# Patient Record
Sex: Female | Born: 2000 | Race: White | Hispanic: No | Marital: Single | Smoking: Never smoker
Health system: Southern US, Community
[De-identification: ages and names within clinical notes are randomized; demographics above are authoritative.]

## PROBLEM LIST (undated history)

## (undated) DIAGNOSIS — Q898 Other specified congenital malformations: Secondary | ICD-10-CM

## (undated) DIAGNOSIS — Q142 Congenital malformation of optic disc: Secondary | ICD-10-CM

## (undated) DIAGNOSIS — F88 Other disorders of psychological development: Secondary | ICD-10-CM

## (undated) DIAGNOSIS — I7300 Raynaud's syndrome without gangrene: Secondary | ICD-10-CM

## (undated) DIAGNOSIS — R1310 Dysphagia, unspecified: Secondary | ICD-10-CM

## (undated) DIAGNOSIS — F429 Obsessive-compulsive disorder, unspecified: Secondary | ICD-10-CM

## (undated) DIAGNOSIS — H9191 Unspecified hearing loss, right ear: Secondary | ICD-10-CM

## (undated) DIAGNOSIS — E049 Nontoxic goiter, unspecified: Secondary | ICD-10-CM

## (undated) DIAGNOSIS — Z931 Gastrostomy status: Secondary | ICD-10-CM

## (undated) DIAGNOSIS — F909 Attention-deficit hyperactivity disorder, unspecified type: Secondary | ICD-10-CM

## (undated) DIAGNOSIS — R1115 Cyclical vomiting syndrome unrelated to migraine: Secondary | ICD-10-CM

## (undated) DIAGNOSIS — F845 Asperger's syndrome: Secondary | ICD-10-CM

## (undated) DIAGNOSIS — R6250 Unspecified lack of expected normal physiological development in childhood: Secondary | ICD-10-CM

## (undated) HISTORY — DX: Cyclical vomiting syndrome unrelated to migraine: R11.15

## (undated) HISTORY — DX: Unspecified hearing loss, right ear: H91.91

## (undated) HISTORY — DX: Congenital malformation of optic disc: Q14.2

## (undated) HISTORY — PX: GASTROSTOMY W/ FEEDING TUBE: SUR642

## (undated) HISTORY — DX: Other disorders of psychological development: F88

## (undated) HISTORY — DX: Gastrostomy status: Z93.1

## (undated) HISTORY — DX: Dysphagia, unspecified: R13.10

## (undated) HISTORY — DX: Nontoxic goiter, unspecified: E04.9

## (undated) HISTORY — DX: Obsessive-compulsive disorder, unspecified: F42.9

## (undated) HISTORY — PX: NISSEN FUNDOPLICATION: SHX2091

## (undated) HISTORY — PX: TONSILLECTOMY AND ADENOIDECTOMY: SHX28

## (undated) HISTORY — DX: Unspecified lack of expected normal physiological development in childhood: R62.50

## (undated) HISTORY — DX: Other specified congenital malformations: Q89.8

## (undated) HISTORY — DX: Asperger's syndrome: F84.5

## (undated) HISTORY — DX: Attention-deficit hyperactivity disorder, unspecified type: F90.9

## (undated) HISTORY — PX: PEG TUBE PLACEMENT: SUR1034

## (undated) HISTORY — DX: Raynaud's syndrome without gangrene: I73.00

---
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
## 2021-01-13 ENCOUNTER — Encounter (INDEPENDENT_AMBULATORY_CARE_PROVIDER_SITE_OTHER): Payer: Self-pay | Admitting: Pediatric Endocrinology

## 2021-01-14 ENCOUNTER — Other Ambulatory Visit (INDEPENDENT_AMBULATORY_CARE_PROVIDER_SITE_OTHER): Payer: Self-pay | Admitting: Pediatric Endocrinology

## 2021-01-14 DIAGNOSIS — F5082 Avoidant/restrictive food intake disorder: Secondary | ICD-10-CM

## 2021-01-16 ENCOUNTER — Encounter (INDEPENDENT_AMBULATORY_CARE_PROVIDER_SITE_OTHER): Payer: Self-pay | Admitting: Pediatric Endocrinology

## 2021-01-16 ENCOUNTER — Other Ambulatory Visit: Payer: Self-pay

## 2021-01-16 ENCOUNTER — Ambulatory Visit (INDEPENDENT_AMBULATORY_CARE_PROVIDER_SITE_OTHER): Payer: BC Managed Care – PPO | Admitting: Pediatric Endocrinology

## 2021-01-16 VITALS — BP 110/60 | HR 92 | Ht 60.63 in | Wt 85.2 lb

## 2021-01-16 DIAGNOSIS — E300 Delayed puberty: Secondary | ICD-10-CM

## 2021-01-16 DIAGNOSIS — Z79899 Other long term (current) drug therapy: Secondary | ICD-10-CM

## 2021-01-16 DIAGNOSIS — Z79818 Long term (current) use of other agents affecting estrogen receptors and estrogen levels: Secondary | ICD-10-CM

## 2021-01-16 DIAGNOSIS — Q898 Other specified congenital malformations: Secondary | ICD-10-CM | POA: Diagnosis not present

## 2021-01-16 DIAGNOSIS — R6250 Unspecified lack of expected normal physiological development in childhood: Secondary | ICD-10-CM

## 2021-01-16 DIAGNOSIS — Q8989 Other specified congenital malformations: Secondary | ICD-10-CM

## 2021-01-16 DIAGNOSIS — E230 Hypopituitarism: Secondary | ICD-10-CM | POA: Diagnosis not present

## 2021-01-16 NOTE — Progress Notes (Signed)
Subjective:  Patient Name: Kayla Mckinney Date of Birth: 06-09-2000  MRN: [id number]  Kayla Mckinney  presents to the office today for follow-up evaluation and management  of her CHARGE association and growth delay  HISTORY OF PRESENT ILLNESS:   Kayla Mckinney is a 20 y.o. Caucasian female .  Kayla Mckinney was accompanied by her mother  1. Kayla Mckinney was first seen in our clinic on 11/01/09 in referral from her primary care pediatrician, Dr. 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 During the first few months of life, the baby had significant problems with reflux, vomiting, and growth delay. CHARGE Association was diagnosed at about 74 weeks of age by Dr. Ellamae Sia, who was then the geneticist at [facility] in [place]. Kayla Mckinney has since been followed by Dr. Roel Cluck of the [facility] in [place]. Her PEG feeding tube was placed at 6 months. She was at about the 10th percentile for length up through age 74, but then slowly fell off the growth curve and was at less than 3rd percentile. 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 She wears a hearing aid in her right ear. 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 The child underwent a Nissen fundoplication at about 52 months of age. The child was noted to have a poor sense of smell and abnormal taste sensation. Muscle tone was low. Her  fifth DIP joints did not bend. She had a very high tolerance for pain.  Laboratory data from 05/31/09 showed a normal CMP. Her TSH was 2.537, free T4-1 0.30, and free T3-3 0.8, all of which were normal. Her IGF 1-1 was normal at 176. Her IGF BP-3 was normal at 2.6. On 05/31/09 the child's bone age was 6 years 10 months at a chronologic age of 9 years. She had growth hormone stimulation testing on  12/21/1109/08/11. Her peak growth hormone responses were 4.83 and 7.67 respectively (normal >10).  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 However, clinical review and review of the literature does not reveal any advantage to treating CHARGE association patients with growth hormone and it may actually, be detrimental.     2. The patient's last [facility] visit was on 10/10/20. In the interim she has been generally healthy.   Mom was surprised to get a phone call from Adolescent Medicine because she did not know that Kayla Mckinney had asked for the referral.   Mom feels that her main issue is that she will not take time away from her cell phone to eat.   She is now doing 37.5 mcg Vivelle patch. She is changing it on Wed and Saturday. She already changed her patch today. Mom says that she does the patch completely on her own without prompting despite the fact that she needs constant reminders for her daily medications.   She is now taking 4 classes at [employer]. She says that she is eating when she is at school. Mom says that meals are taking a long time because she is so distracted by her phone. She is also skipping meals because she loses track of time when she is on her phone.   She is unsure if her breasts are getting bigger. She thinks that her bra is fitting about the same.   Eating is about the same. She is scheduled  in adolescent medicine on 12/12.   3. Pertinent Review of Systems:   Constitutional: The patient feels "good". The patient seems healthy and active. Eyes: Wears glasses. Neck: There are no recognized problems of the anterior neck.  Heart: There are no recognized heart problems. The ability to play and do other physical activities seems normal.  Lungs: no asthma or wheezing.  Gastrointestinal: Bowel movents seem normal. There are no recognized GI problems. G-tube. Some blood on stool recently.  Reflux Legs: Muscle mass and strength seem normal. The  child can play and perform other physical activities without obvious discomfort. No edema is noted.  Feet: There are no obvious foot problems. No edema is noted. Neurologic: There are no recognized problems with muscle movement and strength, sensation, or coordination. Ears- hearing aid- getting a new one. Has ear tubes.   Puberty- increased breast budding. Some underarm hair. Some pubic hair.  PAST MEDICAL, FAMILY, AND SOCIAL HISTORY  Past Medical History:  Diagnosis Date   ADHD (attention deficit hyperactivity disorder)    Asperger's syndrome    Aspiration of postnatal stomach contents without respiratory symptoms    CHARGE association    Coloboma, optic disc, congenital, left eye    Cyclical vomiting syndrome    G tube feedings (HCC)    Goiter    Hearing loss in right ear    Obsessive compulsive disorder    Physical growth delay    Raynaud's syndrome    Sensory processing difficulty    Swallowing difficulty     Family History  Problem Relation Age of Onset   Thyroid disease Mother        Mom has a goiter.   Thyroid disease Maternal Grandmother        MGM became hypothyroid spontaneously. never had surgery of XRT.   Diabetes Cousin      Current Outpatient Medications:    Amphetamine (ADZENYS XR-ODT PO), Take by mouth., Disp: , Rfl:    Amphetamine ER 12.5 MG TBED, daily., Disp: , Rfl:    dexmethylphenidate (FOCALIN) 10 MG tablet, Take 10 mg by mouth daily., Disp: , Rfl:    dexmethylphenidate (FOCALIN) 5 MG tablet, Take 5 mg by mouth daily. , Disp: , Rfl:    estradiol (VIVELLE-DOT) 0.0375 MG/24HR, Place 1 patch onto the skin 2 (two) times a week., Disp: 8 patch, Rfl: 12   Nutritional Supplements (DUOCAL) POWD, Take by mouth. 4 scoops every meal when she eats at home. Does not get it at school., Disp: , Rfl:    amitriptyline (ELAVIL) 10 MG tablet, Take 10 mg by mouth at bedtime. Reported on 04/12/2015 (Patient not taking: Reported on 11/30/2019), Disp: , Rfl:    hydrOXYzine  (ATARAX) 10 MG tablet, Take 10 mg by mouth at bedtime. Reported on 04/12/2015 (Patient not taking: Reported on 11/30/2019), Disp: , Rfl:    SUMAtriptan (IMITREX) 20 MG/ACT nasal spray, Place into the nose. PRN (Patient not taking: Reported on 04/04/2020), Disp: , Rfl:   Allergies as of 01/16/2021   (No Known Allergies)     reports that she has never smoked. She has never used smokeless tobacco. She reports that she does not drink alcohol and does not use drugs. Pediatric History  Patient Parents   Vannessa, [place] (Mother)   Other Topics Concern   Not on file  Social History Narrative   Lives with mom, mom's boy friend, and sister. Dad is involved.   Graduated from high school.    1st year at [employer]  Primary Care Provider: Lewis Moccasin, MD  ROS: There are no other significant problems involving Kayla Mckinney's other body systems.   Objective:  Vital Signs:   BP 110/60   Pulse 92   Ht 5' 0.63" (1.54 m)   Wt 85 lb 3.2 oz (38.6 kg)   BMI 16.30 kg/m  Blood pressure percentiles are not available for patients who are 18 years or older.   Ht Readings from Last 3 Encounters:  01/16/21 5' 0.63" (1.54 m) (8 %, Z= -1.43)*  10/10/20 5' 0.47" (1.536 m) (7 %, Z= -1.49)*  07/04/20 5' (1.524 m) (5 %, Z= -1.67)*   * Growth percentiles are based on CDC (Girls, 2-20 Years) data.   Wt Readings from Last 3 Encounters:  01/16/21 85 lb 3.2 oz (38.6 kg) (<1 %, Z= -3.43)*  10/10/20 82 lb 12.8 oz (37.6 kg) (<1 %, Z= -3.78)*  07/04/20 84 lb (38.1 kg) (<1 %, Z= -3.61)*   * Growth percentiles are based on CDC (Girls, 2-20 Years) data.   HC Readings from Last 3 Encounters:  No data found for [facility]   Body surface area is 1.28 meters squared.  8 %ile (Z= -1.43) based on CDC (Girls, 2-20 Years) Stature-for-age data based on Stature recorded on 01/16/2021. <1 %ile (Z= -3.43) based on CDC (Girls, 2-20 Years) weight-for-age data using vitals from 01/16/2021. No head circumference on file  for this encounter.   PHYSICAL EXAM:    Constitutional: The patient appears healthy and well nourished. The patient's height and weight are delayed for age. They are appropriate for bone age. Weight has overall been fluctuating in the same range for the past year. She is starting to have more linear growth  Head: The head is normocephalic. Face: broad forehead. Eyes: hypertelorism. Gaze is conjugate. There is no obvious arcus or proptosis. Moisture appears normal. Ears: Low set externally rotated ears Mouth: The oropharynx and tongue appear normal. Dentition appears to be normal for age. Oral moisture is normal. Neck: The neck appears to be visibly normal. The thyroid gland is 10 grams in size. The consistency of the thyroid gland is normal. The thyroid gland is not tender to palpation. Lungs: No increased work of breathing. CTA Heart: Heart rate regular. Pulses and peripheral perfusion regular. RRR S1S2 Abdomen: The abdomen appears to be small in size for the patient's age.  There is no obvious hepatomegaly, splenomegaly, or other mass effect.  G tube in place.  Arms: Muscle size and bulk are normal for age. Hands: There is no obvious tremor. Phalangeal and metacarpophalangeal joints are normal. Palmar muscles are normal for age. Palmar skin is normal. Palmar moisture is also normal. Legs: Muscles appear normal for age. No edema is present. Feet: Feet are normally formed. Dorsalis pedal pulses are normal. Neurologic: Strength is normal for age in both the upper and lower extremities. Muscle tone is normal. Sensation to touch is normal in both the legs and feet.   Puberty: Tanner stage pubic hair: III Tanner stage breast/genital III. Sparse Axillary hair.   LAB DATA:   No results found for this or any previous visit (from the past 504 hour(s)).  Office Visit on 07/04/2020  Component Date Value Ref Range Status   Estradiol, Ultra Sensitive 07/04/2020 20  pg/mL Final   Comment: . Female  Reference Ranges for Estradiol,  Ultrasensitive (pg/mL): Marland Kitchen   Follicular Phase:     39-375   Luteal Phase:         48-440  Postmenopausal Phase: < or = 10 . This test was developed and its analytical performance characteristics have been determined by [facility]. It has not been cleared or approved by FDA. This assay has been validated pursuant to the CLIA regulations and is used for clinical purposes.      Bone age 71/25/21  13 years at 18 years 11 months.    Assessment and Plan:   ASSESSMENT: Kayla Mckinney is a 20 y.o. 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   Growth/Weight - weight is essentially stable Kayla Mckinney requested referral to eating disorder program- referral made to adolescent med - Mom feels that she does not have issues with eating- but she is distracted by her phone and does not make time to eat or to focus on eating.  - ongoing linear growth since last visit - bone age 24 years at CA 77 and 11/12 - She is at 82% of "ideal body weight".   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  - Discussed that with inadequate weight gain growth hormone replacement can increase risks of scoliosis.   PLAN:    1. Diagnostic: Lab Orders         Estradiol, Ultra Sens         Comprehensive metabolic panel         TSH         Magnesium         VITAMIN D 25 Hydroxy (Vit-D Deficiency, Fractures)         Phosphorus         Ferritin         Celiac Disease Panel     2.  Therapeutic: Continue working on getting adequate nutrition for both growth and weight gain. Continue 37.5 mcg Vivelle dot patch. Additional labs obtained for her upcoming appointment in Adolescent Medicine clinic.  3. Patient education: Reviewed growth charts. Discussed need for adequate caloric intake, adequate sleep, for growth.  Discussed questions around eating and her phone time. Discussed referral to adolescent medicine 4. Follow-up: Return in about 3 months (around 04/16/2021).  Dessa Phi, MD  >40 minutes spent today reviewing the medical chart, counseling the patient/family, and documenting today's encounter.

## 2021-01-21 LAB — COMPREHENSIVE METABOLIC PANEL
AG Ratio: 1.6 (calc) (ref 1.0–2.5)
ALT: 15 U/L (ref 5–32)
AST: 15 U/L (ref 12–32)
Albumin: 4.5 g/dL (ref 3.6–5.1)
Alkaline phosphatase (APISO): 144 U/L — ABNORMAL HIGH (ref 36–128)
BUN: 18 mg/dL (ref 7–20)
CO2: 25 mmol/L (ref 20–32)
Calcium: 10.3 mg/dL (ref 8.9–10.4)
Chloride: 104 mmol/L (ref 98–110)
Creat: 0.67 mg/dL (ref 0.50–0.96)
Globulin: 2.8 g/dL (calc) (ref 2.0–3.8)
Glucose, Bld: 74 mg/dL (ref 65–139)
Potassium: 4.6 mmol/L (ref 3.8–5.1)
Sodium: 140 mmol/L (ref 135–146)
Total Bilirubin: 0.4 mg/dL (ref 0.2–1.1)
Total Protein: 7.3 g/dL (ref 6.3–8.2)

## 2021-01-21 LAB — CELIAC DISEASE PANEL
(tTG) Ab, IgA: <1 U/mL
(tTG) Ab, IgG: <1 U/mL
Gliadin IgA: <1 U/mL
Gliadin IgG: <1 U/mL
Immunoglobulin A: 104 mg/dL (ref 47–310)

## 2021-01-21 LAB — ESTRADIOL, ULTRA SENS: Estradiol, Ultra Sensitive: 18 pg/mL

## 2021-01-21 LAB — PHOSPHORUS: Phosphorus: 5.3 mg/dL — ABNORMAL HIGH (ref 2.7–5.0)

## 2021-01-21 LAB — FERRITIN: Ferritin: 74 ng/mL (ref 16–154)

## 2021-01-21 LAB — VITAMIN D 25 HYDROXY (VIT D DEFICIENCY, FRACTURES): Vit D, 25-Hydroxy: 19 ng/mL — ABNORMAL LOW (ref 30–100)

## 2021-01-21 LAB — TSH: TSH: 2.07 mIU/L

## 2021-01-21 LAB — MAGNESIUM: Magnesium: 2.2 mg/dL (ref 1.5–2.5)

## 2021-01-22 ENCOUNTER — Encounter (INDEPENDENT_AMBULATORY_CARE_PROVIDER_SITE_OTHER): Payer: Self-pay | Admitting: Pediatric Endocrinology

## 2021-01-28 ENCOUNTER — Ambulatory Visit: Payer: BC Managed Care – PPO | Admitting: Pediatrics

## 2021-01-28 ENCOUNTER — Other Ambulatory Visit (HOSPITAL_COMMUNITY)
Admission: RE | Admit: 2021-01-28 | Discharge: 2021-01-28 | Disposition: A | Discharge status: Home / Self Care | Payer: BC Managed Care – PPO | Source: Ambulatory Visit | Attending: Pediatrics | Admitting: Pediatrics

## 2021-01-28 ENCOUNTER — Other Ambulatory Visit: Payer: Self-pay

## 2021-01-28 VITALS — BP 97/62 | HR 96 | Ht 61.0 in | Wt 85.0 lb

## 2021-01-28 DIAGNOSIS — Z113 Encounter for screening for infections with a predominantly sexual mode of transmission: Secondary | ICD-10-CM | POA: Diagnosis not present

## 2021-01-28 DIAGNOSIS — F5082 Avoidant/restrictive food intake disorder: Secondary | ICD-10-CM | POA: Diagnosis not present

## 2021-01-28 DIAGNOSIS — E300 Delayed puberty: Secondary | ICD-10-CM | POA: Diagnosis not present

## 2021-01-28 DIAGNOSIS — E559 Vitamin D deficiency, unspecified: Secondary | ICD-10-CM | POA: Diagnosis not present

## 2021-01-28 DIAGNOSIS — Z3202 Encounter for pregnancy test, result negative: Secondary | ICD-10-CM | POA: Diagnosis not present

## 2021-01-28 DIAGNOSIS — Z1389 Encounter for screening for other disorder: Secondary | ICD-10-CM

## 2021-01-28 LAB — POCT URINALYSIS DIPSTICK
Bilirubin, UA: NEGATIVE
Blood, UA: NEGATIVE
Glucose, UA: NEGATIVE
Ketones, UA: NEGATIVE
Leukocytes, UA: NEGATIVE
Nitrite, UA: NEGATIVE
Protein, UA: POSITIVE — AB
Spec Grav, UA: 1.02 (ref 1.010–1.025)
Urobilinogen, UA: 1 E.U./dL
pH, UA: 5 (ref 5.0–8.0)

## 2021-01-28 LAB — POCT URINE PREGNANCY: Preg Test, Ur: NEGATIVE

## 2021-01-28 MED ORDER — VITAMIN D (ERGOCALCIFEROL) 1.25 MG (50000 UNIT) PO CAPS: 50000.0000 [IU] | ORAL_CAPSULE | ORAL | 0 refills | Status: AC

## 2021-01-28 MED ORDER — CYPROHEPTADINE HCL 2 MG/5ML PO SYRP
2.0000 mg | ORAL_SOLUTION | Freq: Two times a day (BID) | ORAL | 1 refills | Status: AC
Start: 2021-01-28 — End: 2021-02-27

## 2021-01-28 NOTE — Patient Instructions (Addendum)
    Using the Plate-by-Plate method:  -50% grains/starches -25% fruits/vegetables -25% protein -1 side serving of dairy or dairy alternative -1 side serving of fat/oil  The plate should be a 10 inch plate that is smooth, without ridges or inner circles. Each meal should include all 5 food groups. The plate should not look "dry" meaning foods should be cooked in fats/oils when appropriate. The meal should "make sense" and be cohesive; don't plate foods that wouldn't normally go together. Foods should have a good variety and include all of the foods previously eaten before the disorded eating started. The plate should be full and will advance to heaping. Snacks will also be added in when appropriate and contain at least 2 food groups and may add more as the plan advances.  To Schedule an ECG: [phone]

## 2021-01-28 NOTE — Progress Notes (Signed)
THIS RECORD MAY CONTAIN CONFIDENTIAL INFORMATION THAT SHOULD NOT BE RELEASED WITHOUT REVIEW OF THE SERVICE PROVIDER.  Adolescent Medicine Consultation Initial Visit Kayla Mckinney  is a 20 y.o. female referred by Fanny Bien, MD here today for evaluation of weight loss with concern for disordered eating.      Growth Chart Viewed? yes  Previsit planning completed:  yes   History was provided by the patient and mother.  PCP Confirmed?  yes  My Chart Activated?   yes    HPI:    History of CHARGE syndrome, ADHD, ASD, migraine, delayed puberty, conductive hearing loss, and weight loss. Of note had a G-tube placed at 7 months of age and used it up until ~2018. Family unable to remember last G-tube regimen combined with oral foods. Was supposed to have it removed prior to [address] states that Kayla Mckinney never thinks to eat. Is also always on her phone during meal times and gets distracted. Has very little appetite while on her ADHD meds. When not taking her meds (happens every now and then in an afternoon, mom helps with morning meds but Kayla Mckinney is responsible for her afternoon focalin), she will make avocado toast with honey, sometimes parfaits. Mom not with her for all meals so not sure how much she actually eats.  Home meds - adzenys every morning (unsure of dose), focalin 15 mg every afternoon  Meal plan:  - Breakfast - sometimes omelette or parfait - Lunch - cafeteria food, fills plate with sometimes pintos, collard greens, mac & cheese, chicken; unsure of how many bites - Dinner - chicken casserole and cantaloupe - Does not typically snack during the day - Chocolate Milk - 1 cup daily - Milk nightly (1 cup) with 4 scoops duocal (doesn't drink it when family is not present) Water intake: carries a water bottle, fills it once daily Dietitian: last seen in 04/2019 Activity level: plays with dogs, no sports or regular exercise School: [employer] care:  established with a dentist Sleep: unsure of how many hours nightly Binge/purge: denies Constipation: sometimes hard, looks like a snack, stools daily Menstrual patterns: not started, wearing estrogen patch  Denies abdominal pain, N/V/D, choking/gagging, dizziness, or headaches.  No LMP recorded. Patient is premenarcheal.  No Known Allergies Outpatient Medications Prior to Visit  Medication Sig Dispense Refill   Amphetamine (ADZENYS XR-ODT PO) Take by mouth.     dexmethylphenidate (FOCALIN) 10 MG tablet Take 10 mg by mouth daily.     dexmethylphenidate (FOCALIN) 5 MG tablet Take 5 mg by mouth daily.      estradiol (VIVELLE-DOT) 0.0375 MG/24HR Place 1 patch onto the skin 2 (two) times a week. 8 patch 12   Nutritional Supplements (DUOCAL) POWD Take by mouth. 4 scoops every meal when she eats at home. Does not get it at school.     Amphetamine ER 12.5 MG TBED daily.     hydrOXYzine (ATARAX) 10 MG tablet Take 10 mg by mouth at bedtime. Reported on 04/12/2015 (Patient not taking: Reported on 11/30/2019)     SUMAtriptan (IMITREX) 20 MG/ACT nasal spray Place into the nose. PRN (Patient not taking: Reported on 04/04/2020)     amitriptyline (ELAVIL) 10 MG tablet Take 10 mg by mouth at bedtime. Reported on 04/12/2015 (Patient not taking: Reported on 11/30/2019)     No facility-administered medications prior to visit.     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   Delayed bone age 34/04/2013   Parent coping with child illness or disability 03/09/2012   Unintentional weight loss 11/05/2011   Short stature associated with genetic disorder 11/05/2011   Goiter    Physical growth delay    Obsessive compulsive disorder    Asperger's syndrome    Aspiration of postnatal stomach contents without respiratory symptoms    G tube feedings (HCC)    Coloboma, optic disc,  congenital, left eye    Hearing loss in right ear    ADHD (attention deficit hyperactivity disorder)    Sensory processing difficulty    Raynaud's syndrome    Swallowing difficulty    Cyclical vomiting syndrome    Lack of expected normal physiological development in childhood 07/04/2010   Goiter, unspecified 07/04/2010   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    Past Medical History:  Reviewed and updated?  yes Past Medical History:  Diagnosis Date   ADHD (attention deficit hyperactivity disorder)    Asperger's syndrome    Aspiration of postnatal stomach contents without respiratory symptoms    CHARGE association    Coloboma, optic disc, congenital, left eye    Cyclical vomiting syndrome    G tube feedings (HCC)    Goiter    Hearing loss in right ear    Obsessive compulsive disorder    Physical growth delay    Raynaud's syndrome    Sensory processing difficulty    Swallowing difficulty     Family History: Reviewed and updated? yes Family History  Problem Relation Age of Onset   Thyroid disease Mother        Mom has a goiter.   Thyroid disease Maternal Grandmother        MGM became hypothyroid spontaneously. never had surgery of XRT.   Diabetes Cousin     Social History: Lives with:  mother and step-dad and sister  and describes home situation as good School: taking classes at [employer]:  unsure Exercise:  none Sports:  none Sleep:  as above  Confidentiality was discussed with the patient and if applicable, with caregiver as well.  Enter confidential phone number in Family Comments section of [employer] Tobacco?  no Drugs/ETOH?  no Sexually Active?  no   Trauma currently or in the pastt?  History of frequent medial encounters in the setting of CHARGE syndrome Suicidal or Self-Harm thoughts?   no  The following portions of the patient's history were reviewed and updated as appropriate: allergies, current medications, past family history, past medical  history, past social history, past surgical history, and problem list.  Physical Exam:  Vitals:   01/28/21 1428 01/28/21 1442  BP: 93/60 97/62  Pulse: (!) 103 96  Weight: 85 lb (38.6 kg)   Height: _0  (1.549 m)    BP 97/62   Pulse 96   Ht _1  (1.549 m)   Wt 85 lb (38.6 kg)   BMI 16.06 kg/m  Body mass index: body mass index is 16.06 kg/m. Blood pressure percentiles are not available for patients who are 18 years or older.  Physical Exam Vitals and nursing note reviewed.  Constitutional:      Appearance: Normal appearance.  HENT:     Nose: Nose normal.     Mouth/Throat:     Mouth: Mucous membranes are moist.     Pharynx: Oropharynx is clear.  Eyes:     Conjunctiva/sclera: Conjunctivae normal.  Cardiovascular:  Rate and Rhythm: Normal rate and regular rhythm.  Pulmonary:     Effort: Pulmonary effort is normal.  Abdominal:     General: Abdomen is flat. There is no distension.     Palpations: Abdomen is soft. There is no mass.     Tenderness: There is no abdominal tenderness. There is no rebound.     Comments: G-tube site c/d/i  Musculoskeletal:     Cervical back: Normal range of motion and neck supple.  Skin:    General: Skin is warm and dry.     Capillary Refill: Capillary refill takes less than 2 seconds.  Neurological:     Mental Status: She is alert.     Assessment/Plan: 1. Pregnancy examination or test, negative result Denies current or prior sexual activity, screening completed per protocol - POCT urine pregnancy  2. Routine screening for STI (sexually transmitted infection) Denies current or prior sexual activity, screening completed per protocol - Urine cytology ancillary only  3. Avoidant-restrictive food intake disorder (ARFID) Patient with a history of CHARGE syndrome, ADHD, ASD, delayed puberty, conductive hearing loss, and weight loss. Clinically meets criteria for ARFID today given lack of interest in eating accompanied by failure to  achieve expected weight gain. BMI currently reflecting underweight status at 16. It appears as if Madolyn's weight has progressively downtrended since she stopped supplementing her oral intake with G-tube feeds ~3 years ago - Amb ref to Medical Nutrition Therapy-MNT; will work closely with nutrition to come up with a plan to fulfill caloric needs with oral foods, but unsure if Clotine will be able to meet such goals given her lack of interest in eating and frequent lack of supervision at meal times. Appreciate recommendations regarding whether or not to reinitiate supplementation with enteral feeds. Also encouraged family to re-establish with peds surgery to recheck if G-tube is still functional  - Will start cyproheptadine 2 mg BID - Given risk for refeeding syndrome with obtain EKG 12-Lead - Plate-by-Plate Approach handout provided - Follow up in 3 weeks for refeeding labs and weight recheck, at next visit will have conversation regarding mother's thoughts on obtaining guardianship if there are concerns that Domanique does not have the capacity to always remember to eat/take her medicines   4. Delayed puberty Has not yet started menstruation in the setting of her medical comorbidities, low BMI is likely contributing.  - On transdermal estrogen and following closely with peds endo  5. Vitamin D deficiency Vitamin D level obtained 01/16/21 low at 19 - Vitamin D, Ergocalciferol, (DRISDOL) 1.25 MG (50000 UNIT) CAPS capsule; Take 1 capsule (50,000 Units total) by mouth every 7 (seven) days.  Dispense: 15 capsule; Refill: 0 - Will likely need future DEXA scan   Follow-up:   Return in about 3 weeks (around 02/18/2021) for refeeding labs and weight check.   Medical decision-making:  > 60 minutes spent, more than 50% of appointment was spent discussing diagnosis and management of symptoms

## 2021-01-28 NOTE — Progress Notes (Deleted)
Integrated Behavioral Health Initial In-Person Visit  MRN: [id number] Name: AYSHA LIVECCHI  Number of Integrated Behavioral Health Clinician visits:: {IBH Number of Visits:21014052} Session Start time: ***  Session End time: *** Total time: {IBH Total Time:21014050} minutes  Types of Service: {CHL AMB TYPE OF SERVICE:[phone]}  Interpretor:{yes AQ:[id number]} Interpretor Name and Language: ***   Warm Hand Off Completed.        Subjective: ELVIRA LANGSTON is a 20 y.o.  accompanied by {CHL AMB ACCOMPANIED FH:[id number]} Patient was referred by *** for ***. Patient reports the following symptoms/concerns: *** Duration of problem: ***; Severity of problem: {Mild/Moderate/Severe:20260}  Objective: Mood: {BHH MOOD:22306} and Affect: {BHH AFFECT:22307} Risk of harm to self or others: {CHL AMB BH Suicide Current Mental Status:21022748}  Life Context: Family and Social: *** School/Work: *** Self-Care: *** Life Changes: ***  Patient and/or Family's Strengths/Protective Factors: {CHL AMB BH PROTECTIVE FACTORS:[phone]}  Goals Addressed: Patient will: Reduce symptoms of: {IBH Symptoms:21014056} Increase knowledge and/or ability of: {IBH Patient Tools:21014057}  Demonstrate ability to: {IBH Goals:21014053}  Progress towards Goals: {CHL AMB BH PROGRESS TOWARDS [phone]}  Interventions: Interventions utilized: {IBH Interventions:21014054}  Standardized Assessments completed: {IBH Screening Tools:21014051}  Patient and/or Family Response: ***  Patient Centered Plan: Patient is on the following Treatment Plan(s):  ***  Assessment: Patient currently experiencing ***.   Patient may benefit from ***.  Plan: Follow up with behavioral health clinician on : *** Behavioral recommendations: *** Referral(s): {IBH Referrals:21014055} "From scale of 1-10, how likely are you to follow plan?": ***  Cquadayshia L Cedric Fishman

## 2021-01-30 LAB — URINE CYTOLOGY ANCILLARY ONLY
Bacterial Vaginitis-Urine: NEGATIVE
Candida Urine: NEGATIVE
Chlamydia: NEGATIVE
Comment: NEGATIVE
Comment: NEGATIVE
Comment: NORMAL
Neisseria Gonorrhea: NEGATIVE
Trichomonas: NEGATIVE

## 2021-02-05 DIAGNOSIS — E559 Vitamin D deficiency, unspecified: Secondary | ICD-10-CM | POA: Insufficient documentation

## 2021-02-05 DIAGNOSIS — F5082 Avoidant/restrictive food intake disorder: Secondary | ICD-10-CM | POA: Insufficient documentation

## 2021-04-29 ENCOUNTER — Other Ambulatory Visit: Payer: Self-pay | Admitting: Pediatrics

## 2021-04-29 DIAGNOSIS — E559 Vitamin D deficiency, unspecified: Secondary | ICD-10-CM

## 2021-07-22 ENCOUNTER — Telehealth: Payer: BC Managed Care – PPO | Admitting: Physician Assistant

## 2021-07-22 DIAGNOSIS — J329 Chronic sinusitis, unspecified: Secondary | ICD-10-CM

## 2021-07-22 MED ORDER — AMOXICILLIN-POT CLAVULANATE 875-125 MG PO TABS: 1.0000 | ORAL_TABLET | Freq: Two times a day (BID) | ORAL | 0 refills | Status: AC

## 2021-07-22 NOTE — Progress Notes (Signed)

## 2021-09-19 HISTORY — PX: WISDOM TOOTH EXTRACTION: SHX21

## 2021-11-25 ENCOUNTER — Other Ambulatory Visit (INDEPENDENT_AMBULATORY_CARE_PROVIDER_SITE_OTHER): Payer: Self-pay | Admitting: Pediatric Endocrinology

## 2021-12-25 ENCOUNTER — Ambulatory Visit (INDEPENDENT_AMBULATORY_CARE_PROVIDER_SITE_OTHER): Payer: BC Managed Care – PPO | Admitting: Pediatric Endocrinology

## 2021-12-25 ENCOUNTER — Encounter (INDEPENDENT_AMBULATORY_CARE_PROVIDER_SITE_OTHER): Payer: Self-pay | Admitting: Pediatric Endocrinology

## 2021-12-25 VITALS — BP 112/74 | HR 96 | Ht 60.39 in | Wt 97.6 lb

## 2021-12-25 DIAGNOSIS — R6252 Short stature (child): Secondary | ICD-10-CM | POA: Diagnosis not present

## 2021-12-25 DIAGNOSIS — Q898 Other specified congenital malformations: Secondary | ICD-10-CM | POA: Diagnosis not present

## 2021-12-25 DIAGNOSIS — Z931 Gastrostomy status: Secondary | ICD-10-CM

## 2021-12-25 DIAGNOSIS — E230 Hypopituitarism: Secondary | ICD-10-CM

## 2021-12-25 DIAGNOSIS — Z79818 Long term (current) use of other agents affecting estrogen receptors and estrogen levels: Secondary | ICD-10-CM

## 2021-12-25 DIAGNOSIS — Z79899 Other long term (current) drug therapy: Secondary | ICD-10-CM

## 2021-12-25 LAB — ESTRADIOL: Estradiol: <15 pg/mL

## 2021-12-25 NOTE — Progress Notes (Signed)
Subjective:  Patient Name: Kayla Mckinney Date of Birth: 03-27-2000  MRN: [id number]  Kayla Mckinney  presents to the office today for follow-up evaluation and management  of her CHARGE association and growth delay  HISTORY OF PRESENT ILLNESS:   Kayla Mckinney is a 21 y.o. Caucasian female .  Danahi was accompanied by her mother and sister   1. Kayla Mckinney was first seen in our clinic on 11/01/09 in referral from her primary care pediatrician, Dr. 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 During the first few months of life, the baby had significant problems with reflux, vomiting, and growth delay. CHARGE Association was diagnosed at about 20 weeks of age by Dr. Ellamae Sia, who was then the geneticist at [facility] in [place]. Kayla Mckinney has since been followed by Dr. Roel Cluck of the [facility] in [place]. Her PEG feeding tube was placed at 6 months. She was at about the 10th percentile for length up through age 25, but then slowly fell off the growth curve and was at less than 3rd percentile. 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 She wears a hearing aid in her right ear. 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 The child underwent a Nissen fundoplication at about 8 months of age. The child was noted to have a poor sense of smell and abnormal taste sensation. Muscle tone was low. Her  fifth DIP joints did not bend. She had a very high tolerance for pain.  Laboratory data from 05/31/09 showed a normal CMP. Her TSH was 2.537, free T4-1 0.30, and free T3-3 0.8, all of which were normal. Her IGF 1-1 was normal at 176. Her IGF BP-3 was normal at 2.6. On 05/31/09 the child's bone age was 6 years 10 months at a chronologic age of 9 years. She had growth hormone stimulation testing  on 12/21/1109/08/11. Her peak growth hormone responses were 4.83 and 7.67 respectively (normal >10).  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 However, clinical review and review of the literature does not reveal any advantage to treating CHARGE association patients with growth hormone and it may actually, be detrimental.     2. The patient's last [facility] visit was on 01/16/21. In the interim she has been generally healthy.   She has continued on Vivelle dot. She changes her patch on Wednesdays and Saturdays.  She feels that her breasts have gotten bigger. She had to get new bras. Her sister says that she is more moody.   She is now doing 37.5 mcg Vivelle patch.  She has been complaining about her g-tube for the past 2 days after she checked her balloon.   She has not been getting much discharge. She feels that she saw it once.   She feels that she is "doing great" with eating. Kayla Mckinney feeds her breakfast in the morning. She likes carrots for a snack.   3. Pertinent Review of Systems:   Constitutional: The patient feels "good". The patient seems healthy and active. Eyes: Wears glasses. Neck: There are no recognized problems of the anterior neck.  Heart: There are no recognized heart problems. The ability to play and do other physical activities seems normal.  Lungs: no asthma or wheezing.  Gastrointestinal: Bowel movents seem normal. There are no recognized GI problems. G-tube. Some blood  on stool recently.  Issues with her g-tube Legs: Muscle mass and strength seem normal. The child can play and perform other physical activities without obvious discomfort. No edema is noted.  Feet: There are no obvious foot problems. No edema is noted. Neurologic: There are no recognized problems with muscle movement and strength, sensation, or coordination. Ears- hearing aid- getting a new one. Has ear tubes.   Puberty- increased breast growth.   PAST MEDICAL, FAMILY, AND SOCIAL  HISTORY  Past Medical History:  Diagnosis Date   ADHD (attention deficit hyperactivity disorder)    Asperger's syndrome    Aspiration of postnatal stomach contents without respiratory symptoms    CHARGE association    Coloboma, optic disc, congenital, left eye    Cyclical vomiting syndrome    G tube feedings (HCC)    Goiter    Hearing loss in right ear    Obsessive compulsive disorder    Physical growth delay    Raynaud's syndrome    Sensory processing difficulty    Swallowing difficulty     Family History  Problem Relation Age of Onset   Thyroid disease Mother        Kayla Mckinney has a goiter.   Thyroid disease Maternal Grandmother        MGM became hypothyroid spontaneously. never had surgery of XRT.   Diabetes Cousin      Current Outpatient Medications:    Amphetamine (ADZENYS XR-ODT PO), Take by mouth., Disp: , Rfl:    dexmethylphenidate (FOCALIN) 10 MG tablet, Take 10 mg by mouth daily., Disp: , Rfl:    dexmethylphenidate (FOCALIN) 5 MG tablet, Take 5 mg by mouth daily. , Disp: , Rfl:    DOTTI 0.0375 MG/24HR, Place 1 patch onto the skin 2 (two) times a week., Disp: 8 patch, Rfl: 12   Nutritional Supplements (DUOCAL) POWD, Take by mouth. 4 scoops every meal when she eats at home. Does not get it at school., Disp: , Rfl:    SUMAtriptan (IMITREX) 20 MG/ACT nasal spray, Place into the nose. PRN, Disp: , Rfl:    Vitamin D, Ergocalciferol, (DRISDOL) 1.25 MG (50000 UNIT) CAPS capsule, Take 1 capsule (50,000 Units total) by mouth every 7 (seven) days., Disp: 15 capsule, Rfl: 0   cyproheptadine (PERIACTIN) 2 MG/5ML syrup, Take 5 mLs (2 mg total) by mouth 2 (two) times daily., Disp: 300 mL, Rfl: 1   hydrOXYzine (ATARAX) 10 MG tablet, Take 10 mg by mouth at bedtime. Reported on 04/12/2015 (Patient not taking: Reported on 11/30/2019), Disp: , Rfl:   Allergies as of 12/25/2021   (No Known Allergies)     reports that she has never smoked. She has never been exposed to tobacco smoke. She has  never used smokeless tobacco. She reports that she does not drink alcohol and does not use drugs. Pediatric History  Patient Parents   Janazia, Schreier (Mother)   Other Topics Concern   Not on file  Social History Narrative   Lives with Kayla Mckinney, Kayla Mckinney's boy friend, and sister. Dad is involved.   Graduated from high school.    2nd year at [employer]   Primary Care Provider: Lewis Moccasin, MD  ROS: There are no other significant problems involving Margerie's other body systems.   Objective:  Vital Signs:   BP 112/74 (BP Location: Right Arm, Patient Position: Sitting, Cuff Size: Small)   Pulse 96   Ht 5' 0.39" (1.534 m)   Wt 97 lb 9.6 oz (44.3 kg)   LMP  (  Exact Date)   BMI 18.81 kg/m  Growth %ile SmartLinks can only be used for patients less than 63 years old.   Ht Readings from Last 3 Encounters:  12/25/21 5' 0.39" (1.534 m)  01/28/21 5\' 1"  (1.549 m) (10 %, Z= -1.29)*  01/16/21 5' 0.63" (1.54 m) (8 %, Z= -1.43)*   * Growth percentiles are based on CDC (Girls, 2-20 Years) data.   Wt Readings from Last 3 Encounters:  12/25/21 97 lb 9.6 oz (44.3 kg)  01/28/21 85 lb (38.6 kg) (<1 %, Z= -3.45)*  01/16/21 85 lb 3.2 oz (38.6 kg) (<1 %, Z= -3.43)*   * Growth percentiles are based on CDC (Girls, 2-20 Years) data.   HC Readings from Last 3 Encounters:  No data found for [facility]   Body surface area is 1.37 meters squared.  Facility age limit for growth %iles is 20 years. Facility age limit for growth %iles is 20 years. Facility age limit for growth %iles is 20 years.   PHYSICAL EXAM:    Constitutional: The patient appears healthy and well nourished. The patient's height and weight are delayed for age. Height is stable. She has had good interval weight gain. (12 pounds) Head: The head is normocephalic. Face: broad forehead. Eyes: hypertelorism. Gaze is conjugate. There is no obvious arcus or proptosis. Moisture appears normal. Ears: Low set externally rotated ears Mouth:  The oropharynx and tongue appear normal. Dentition appears to be normal for age. Oral moisture is normal. Neck: The neck appears to be visibly normal. The thyroid gland is 10 grams in size. The consistency of the thyroid gland is normal. The thyroid gland is not tender to palpation. Lungs: No increased work of breathing. CTA Heart: Heart rate regular. Pulses and peripheral perfusion regular. RRR S1S2 Abdomen: The abdomen appears to be small in size for the patient's age.  There is no obvious hepatomegaly, splenomegaly, or other mass effect.  G tube in place.  Arms: Muscle size and bulk are normal for age. Hands: There is no obvious tremor. Phalangeal and metacarpophalangeal joints are normal. Palmar muscles are normal for age. Palmar skin is normal. Palmar moisture is also normal. Legs: Muscles appear normal for age. No edema is present. Feet: Feet are normally formed. Dorsalis pedal pulses are normal. Neurologic: Strength is normal for age in both the upper and lower extremities. Muscle tone is normal. Sensation to touch is normal in both the legs and feet.   Puberty: Tanner stage pubic hair: III Tanner stage breast/genital III. Sparse Axillary hair.   LAB DATA:   No results found for this or any previous visit (from the past 504 hour(s)).    Assessment and Plan:   ASSESSMENT: Lillien is a 21 y.o. 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  - Ultrasound ordered   PLAN:    1.  Diagnostic:  Lab Orders         Estradiol     Orders Placed This Encounter  Procedures  US PELVIS (TRANSABDOMINAL ONLY)    Patient with CHARGE syndrome. Unsure if she has uterus/ovaries    Scheduling Instructions:     DO NOT perform transvaginal.    Order Specific Question:   Reason for Exam (SYMPTOM  OR DIAGNOSIS REQUIRED)    Answer:   DO NOT perform transvaginally.    Order Specific Question:   Preferred imaging location?    Answer:   [facility]   Estradiol   Ambulatory referral to Pediatric Surgery    Referral Priority:   Routine    Referral Type:   Surgical    Referral Reason:   Specialty Services Required    Requested Specialty:   Pediatric Surgery    Number of Visits Requested:   1     2. Therapeutic: Continue working on getting adequate nutrition for both growth and weight gain. Continue 37.5 mcg Vivelle dot patch.   Referral placed to surgery for removal of G-Tube. Case discussed with Dr. Gus Puma who has accepted this referral as long as it is uncomplicated. If the hole does not close she will need to be referred to adult GI for secondary revision.   3. Patient education: Reviewed growth charts. Discussed expectation that she will need HRT long term. Discussed that (per Kayla Mckinney) she has never had imaging to determine presence/absence of uterus/ovaries. Will order transabdominal pelvic u/s.   4. Follow-up: Return in about 6 months (around 06/25/2022).  Dessa Phi, MD  >40 minutes spent today reviewing the medical chart, counseling the patient/family, and documenting today's encounter.

## 2021-12-26 ENCOUNTER — Encounter (INDEPENDENT_AMBULATORY_CARE_PROVIDER_SITE_OTHER): Payer: Self-pay | Admitting: Pediatric Endocrinology

## 2021-12-30 ENCOUNTER — Other Ambulatory Visit (INDEPENDENT_AMBULATORY_CARE_PROVIDER_SITE_OTHER): Payer: Self-pay | Admitting: Pediatric Endocrinology

## 2021-12-30 MED ORDER — ESTRADIOL 0.025 MG/24HR TD PTWK: 0.0250 mg | MEDICATED_PATCH | TRANSDERMAL | 5 refills | Status: DC

## 2021-12-31 ENCOUNTER — Telehealth (INDEPENDENT_AMBULATORY_CARE_PROVIDER_SITE_OTHER): Payer: Self-pay | Admitting: Pediatric Endocrinology

## 2021-12-31 ENCOUNTER — Encounter (INDEPENDENT_AMBULATORY_CARE_PROVIDER_SITE_OTHER): Payer: Self-pay | Admitting: Pediatric Endocrinology

## 2021-12-31 NOTE — Telephone Encounter (Signed)
A user error has taken place: encounter opened in error, closed for administrative reasons.

## 2022-01-02 ENCOUNTER — Telehealth (INDEPENDENT_AMBULATORY_CARE_PROVIDER_SITE_OTHER): Payer: Self-pay | Admitting: Pediatric Endocrinology

## 2022-01-02 NOTE — Telephone Encounter (Signed)
Contacted Dr. Chauncy Passy office to request referral be placed to adult surgeon for removal of her G-Tube with primary closure.   Dessa Phi, MD

## 2022-01-15 ENCOUNTER — Ambulatory Visit (HOSPITAL_BASED_OUTPATIENT_CLINIC_OR_DEPARTMENT_OTHER)
Admission: RE | Admit: 2022-01-15 | Discharge: 2022-01-15 | Disposition: A | Discharge status: Home / Self Care | Payer: BC Managed Care – PPO | Source: Ambulatory Visit | Attending: Pediatric Endocrinology | Admitting: Pediatric Endocrinology

## 2022-01-15 DIAGNOSIS — E230 Hypopituitarism: Secondary | ICD-10-CM | POA: Insufficient documentation

## 2022-01-24 ENCOUNTER — Telehealth (INDEPENDENT_AMBULATORY_CARE_PROVIDER_SITE_OTHER): Payer: Self-pay | Admitting: Nurse Practitioner

## 2022-01-24 ENCOUNTER — Ambulatory Visit (INDEPENDENT_AMBULATORY_CARE_PROVIDER_SITE_OTHER): Payer: BC Managed Care – PPO | Admitting: Surgery

## 2022-01-24 NOTE — Telephone Encounter (Signed)
I spoke to Ms. Gonsoulin regarding Kayla Mckinney's appointment with Dr. Gus Puma. Ms. Kern stated she was unaware of the referral to adult surgery for the g-tube removal (placed by Dr. Vanessa Chewelah on 11/16). I discussed the recommendation to send the referral to [place] [facility]. Ms. Busser was in agreement. I called Dr. Chauncy Passy office regarding the referral. The front office representative stated, "we were waiting on mom to tell us where to send the referral." I informed the representative of my discussion with Ms. Sharpe and requested the referral be sent to [facility]. The representative stated she would send the referral.

## 2022-01-25 ENCOUNTER — Encounter (HOSPITAL_COMMUNITY): Payer: Self-pay

## 2022-01-25 ENCOUNTER — Emergency Department (HOSPITAL_COMMUNITY)
Admission: EM | Admit: 2022-01-25 | Discharge: 2022-01-25 | Disposition: A | Discharge status: Home / Self Care | Payer: BC Managed Care – PPO | Attending: Emergency Medicine | Admitting: Emergency Medicine

## 2022-01-25 DIAGNOSIS — Z8616 Personal history of COVID-19: Secondary | ICD-10-CM | POA: Insufficient documentation

## 2022-01-25 DIAGNOSIS — K9423 Gastrostomy malfunction: Secondary | ICD-10-CM | POA: Insufficient documentation

## 2022-01-25 DIAGNOSIS — T85528A Displacement of other gastrointestinal prosthetic devices, implants and grafts, initial encounter: Secondary | ICD-10-CM

## 2022-01-25 NOTE — ED Provider Notes (Signed)
12:45 PM call placed to general surgery and spoke to Dr. Michaell Cowing, discussed the case with him as patient is currently in the waiting room and has been for the past 4 hours.  Patient feeding tube did fall out yesterday.  He reports that the feeding tube has not been used in several years, this can likely be covered with some gauze and they will spontaneously close on its own.  If the feeding tube is still being used, a balloon Foley could be used in order to keep the opening available.  If it has not been used for medicine will likely not need any intervention at this time.  I did report to him that feeding tube has not been used in several years and there is no surrounding erythema, however they have not been able to schedule an outpatient surgeon to have this hole closed.  This information will be relayed to mother in the triage area.   Portions of this note were generated with Scientist, clinical (histocompatibility and immunogenetics). Dictation errors may occur despite best attempts at proofreading.    Claude Manges, PA-C 01/25/22 1250    Bethann Berkshire, MD 01/26/22 [phone]

## 2022-01-25 NOTE — Discharge Instructions (Signed)
Please have Dr. Duanne Guess place a consult for Dr. Michaell Cowing, Adult General Surgery - and once the consult is in schedule an appointment with Dr. Michaell Cowing.  Keep the ostomy bag on over the wound site for now. You may also place a gauze with tape on it in future. Just make sure the site is staying clean and sterile. If it is too moist and gets raw, apply a zinc oxide barrier cream (over the counter).  Please return to the ER if your symptoms get worse.

## 2022-01-25 NOTE — ED Provider Triage Note (Signed)
Emergency Medicine Provider Triage Evaluation Note  Kayla Mckinney , a 21 y.o. female  was evaluated in triage.  Pt complains of feeding tube coming out in the middle of the night. Patient's mother reports this has not been used for over a decade however they were told it needed to be surgically removed.   Review of Systems  Positive: Abdominal pain Negative: Nausea, vomiting  Physical Exam  BP 100/66 (BP Location: Left Arm)   Pulse 95   Temp 98 F (36.7 C) (Oral)   Resp 18   SpO2 100%  Gen:   Awake, no distress   Resp:  Normal effort  MSK:   Moves extremities without difficulty  Other:  Small opening where feeding tube was previously present,no erythema or leaking on the abdomen.   Medical Decision Making  Medically screening exam initiated at 9:28 AM.  Appropriate orders placed.  Kayla Mckinney was informed that the remainder of the evaluation will be completed by another provider, this initial triage assessment does not replace that evaluation, and the importance of remaining in the ED until their evaluation is complete.     Claude Manges, PA-C 01/25/22 [phone]

## 2022-01-25 NOTE — ED Triage Notes (Signed)
Pt presents with c/o feeding tube issues. Pt reports that her feeding tube came out last night. Pt does not currently use her feeding tube and a referral has been in place to have the feeding tube removed surgically.

## 2022-01-25 NOTE — ED Provider Notes (Signed)
[facility]GENCY DEPT Provider Note   CSN: [id number] Arrival date & time: 01/25/22  0815     History  Chief Complaint  Patient presents with   Feeding Tube issue    Kayla Mckinney is a 21 y.o. female.  HPI     21 year old female comes in with chief complaint of feeding tube issues.  Patient had a feeding tube placed when she was 52 months old due to dysphagia, cyclic vomiting syndrome.  Mother provides substantial history.  She states that patient was supposed to get a G-tube taken out pre-COVID.  However, due to COVID-19 lockdown, that surgery was canceled.  Patient was lost to follow-up thereafter.  Recently they have revisited the conversation, and she visited the pediatric general surgeon yesterday, who recommended that patient see an adult surgeon as sometimes surgical intervention might be needed to prevent complications.  Last night, the G-tube fell out.  Mom states that the balloon was " busted."  Patient has no complaints from her side.  She has not had any discharge or drainage but has not eaten or had anything to drink either.  Home Medications Prior to Admission medications   Medication Sig Start Date End Date Taking? Authorizing Provider  estradiol (CLIMARA - DOSED IN MG/24 HR) 0.025 mg/24hr patch Place 1 patch (0.025 mg total) onto the skin once a week. 12/30/21   Dessa Phi, MD  Amphetamine (ADZENYS XR-ODT PO) Take by mouth.    [provider]  cyproheptadine (PERIACTIN) 2 MG/5ML syrup Take 5 mLs (2 mg total) by mouth 2 (two) times daily. 01/28/21 02/27/21  Isla Pence, MD  dexmethylphenidate (FOCALIN) 10 MG tablet Take 10 mg by mouth daily.    [provider]  dexmethylphenidate (FOCALIN) 5 MG tablet Take 5 mg by mouth daily.     [provider]  hydrOXYzine (ATARAX) 10 MG tablet Take 10 mg by mouth at bedtime. Reported on 04/12/2015 Patient not taking: Reported on 11/30/2019    [provider]  Nutritional Supplements (DUOCAL) POWD Take by mouth. 4 scoops every meal when she eats at home. Does not get it at school. 07/05/19   [provider]  SUMAtriptan (IMITREX) 20 MG/ACT nasal spray Place into the nose. PRN 11/07/16   [provider]  Vitamin D, Ergocalciferol, (DRISDOL) 1.25 MG (50000 UNIT) CAPS capsule Take 1 capsule (50,000 Units total) by mouth every 7 (seven) days. 01/28/21   Isla Pence, MD      Allergies    Patient has no known allergies.    Review of Systems   Review of Systems  All other systems reviewed and are negative.   Physical Exam Updated Vital Signs BP 107/71   Pulse 99   Temp 98.9 F (37.2 C) (Oral)   Resp 18   SpO2 100%  Physical Exam Vitals and nursing note reviewed.  Constitutional:      Appearance: She is well-developed.  HENT:     Head: Atraumatic.  Cardiovascular:     Rate and Rhythm: Normal rate.  Pulmonary:     Effort: Pulmonary effort is normal.  Abdominal:     General: Abdomen is flat. There is no distension.     Comments: Left lower quadrant stoma, no drainage  Musculoskeletal:     Cervical back: Normal range of motion and neck supple.  Skin:    General: Skin is warm and dry.  Neurological:     Mental Status: She is alert and oriented to person, place, and  time.     ED Results / Procedures / Treatments   Labs (all labs ordered are listed, but only abnormal results are displayed) Labs Reviewed - No data to display  EKG None  Radiology No results found.  Procedures Procedures    Medications Ordered in ED Medications - No data to display  ED Course/ Medical Decision Making/ A&P                           Medical Decision Making  This patient presents to the ED with chief complaint(s) of G-tube dislodgment with pertinent past medical history of G-tube that was in place for 20 years, and concerns from her pediatric surgeon that she might need surgical intervention at the time of G-tube  removal.The complaint involves an extensive differential diagnosis and also carries with it a high risk of complications and morbidity.    Patient has no complaints from her site.  The stoma site looks normal.  History provided by patient's mother.  Care everywhere also reviewed. According to care everywhere, in 2019 December, patient had a visit with pediatrics general surgery.  Their plan was to see if they can trial G-tube removal and to see if the tract/stoma closes on its own.  If it would fail, and there is a chance it can, they would advance with surgical intervention.  Case discussed with general surgery on-call, Dr. Michaell Cowing. We discussed dislodged G-tube and patient's complex history, low BMI and the fact that patient has not been using G-tube for years and is in the process of wanting it removed and family concerns that it might need surgical intervention at the time of G-tube removal.   Dr. Michaell Cowing recommends that if family wants more time to decide the next step, then we can place a Foley catheter in the stoma and have patient follow-up with general surgery. If the overall goal is for removal of the G-tube, then it is a good idea to not place any catheter in the stoma, and wait and see if it heals on its own.  He recommends that we could put an ostomy bag at the stoma site for any kind of drainage.  Family prefers the latter.  Return precautions discussed. Advised that they contact their PCP to give him a referral to general surgery.  Final Clinical Impression(s) / ED Diagnoses Final diagnoses:  Dislodged gastrostomy tube    Rx / DC Orders ED Discharge Orders     None         Derwood Kaplan, MD 01/25/22 1530

## 2022-01-25 NOTE — Progress Notes (Addendum)
Kayla Mckinney  2001/01/13 [id number]  Patient Care Team: Lewis Moccasin, MD as PCP - General (Family Medicine)   Called from [facility] emergency department.  Patient brought in by mother.  Patient has a history of CHANCE syndrome ADHD and numerous health issues.  Has had issues with feeding and eating for most of her life (since 66 months old) and has had a feeding gastrostomy tube for most of her life.  I believe they were at the point where she was eating better and there was plans to remove the gastrostomy tube.  Request was to see a pediatric surgeon.  Sounds like he was planning to remove it and then hold off on closure unless it was persistent.  Given the patient's age, that was declined and recommendation meant to see an adult Careers adviser.    In the meantime, the gastrostomy tube fell out yesterday.  Mother concerned and brought the 21 year old patient to the ED.  There is no major leaking or drainage.  Discussed with ER department PA and then later Dr. Jeanmarie Plant with the emergency department called a second time.  I noted the 1st option is to leave the gastrostomy tube out and see if this will close.  Concern by mother that it may not and been told it may not.  I did note that sometimes tubes that have been in for many years may not spontaneously close and have a chronic gastrocutaneous fistula that will require formal closure in an operating room.  However, that is not a certainty.  Recommend they try and put a Eakin's pouch or urostomy bag around it to catch and control drainage and see if it will close on its own or cover with Vaseline gauze and see if will close.  If not, may need referral to see surgery & can establish with [facility].    The 2nd option I mentioned to them is to have the ED place a Foley catheter into the Gtube site to act as a Gtube. Then they have time to decide how they wish to deal with the gastrostomy tube.  Looking in the chart looks like there was  discussion about that through Dr. Chauncy Passy office to consider [facility] since ped surgery declined to see the now 21 y/o.  I see no appt made.  I do have concerns that it seems like even last year there were concerns by MDs that the patient was not eating well.  I would not feel comfortable removing this unless I knew there was a primary care physician that was helping follow and take care of this should it need to be replaced. 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   Delayed bone age 28/04/2013   Parent coping with child illness or disability 03/09/2012   Unintentional weight loss 11/05/2011   Short stature associated with genetic disorder 11/05/2011   Goiter    Physical growth delay    Obsessive compulsive disorder    Asperger's syndrome    Aspiration of postnatal stomach contents without respiratory symptoms    G tube feedings (HCC)    Coloboma, optic disc, congenital, left eye    Hearing loss in right ear    ADHD (attention deficit hyperactivity disorder)    Sensory processing difficulty    Raynaud's syndrome  Swallowing difficulty    Cyclical vomiting syndrome    Lack of expected normal physiological development in childhood 07/04/2010   Goiter, unspecified 07/04/2010   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    Past Medical History:  Diagnosis Date   ADHD (attention deficit hyperactivity disorder)    Asperger's syndrome    Aspiration of postnatal stomach contents without respiratory symptoms    CHARGE association    Coloboma, optic disc, congenital, left eye    Cyclical vomiting syndrome    G tube feedings (HCC)    Goiter    Hearing loss in right ear    Obsessive compulsive disorder    Physical growth delay    Raynaud's syndrome    Sensory processing  difficulty    Swallowing difficulty     Past Surgical History:  Procedure Laterality Date   GASTROSTOMY W/ FEEDING TUBE     NISSEN FUNDOPLICATION     PEG TUBE PLACEMENT     TONSILLECTOMY AND ADENOIDECTOMY     WISDOM TOOTH EXTRACTION  09/19/2021   all 4 wisdom teeth extraction    Social History   Socioeconomic History   Marital status: Single    Spouse name: Not on file   Number of children: Not on file   Years of education: Not on file   Highest education level: Not on file  Occupational History   Not on file  Tobacco Use   Smoking status: Never    Passive exposure: Never   Smokeless tobacco: Never  Substance and Sexual Activity   Alcohol use: No   Drug use: No   Sexual activity: Not on file  Other Topics Concern   Not on file  Social History Narrative   Lives with mom, mom's boy friend, and sister. Dad is involved.   Graduated from high school.    Social Determinants of Health   Financial Resource Strain: Not on file  Food Insecurity: Not on file  Transportation Needs: Not on file  Physical Activity: Not on file  Stress: Not on file  Social Connections: Not on file  Intimate Partner Violence: Not on file    Family History  Problem Relation Age of Onset   Thyroid disease Mother        Mom has a goiter.   Thyroid disease Maternal Grandmother        MGM became hypothyroid spontaneously. never had surgery of XRT.   Diabetes Cousin     No current facility-administered medications for this encounter.   Current Outpatient Medications  Medication Sig Dispense Refill   estradiol (CLIMARA - DOSED IN MG/24 HR) 0.025 mg/24hr patch Place 1 patch (0.025 mg total) onto the skin once a week. 4 patch 5   Amphetamine (ADZENYS XR-ODT PO) Take by mouth.     cyproheptadine (PERIACTIN) 2 MG/5ML syrup Take 5 mLs (2 mg total) by mouth 2 (two) times daily. 300 mL 1   dexmethylphenidate (FOCALIN) 10 MG tablet Take 10 mg by mouth daily.     dexmethylphenidate (FOCALIN) 5 MG  tablet Take 5 mg by mouth daily.      hydrOXYzine (ATARAX) 10 MG tablet Take 10 mg by mouth at bedtime. Reported on 04/12/2015 (Patient not taking: Reported on 11/30/2019)     Nutritional Supplements (DUOCAL) POWD Take by mouth. 4 scoops every meal when she eats at home. Does not get it at school.     SUMAtriptan (IMITREX) 20 MG/ACT nasal spray Place into the nose. PRN     Vitamin D, Ergocalciferol, (DRISDOL) 1.25  MG (50000 UNIT) CAPS capsule Take 1 capsule (50,000 Units total) by mouth every 7 (seven) days. 15 capsule 0     No Known Allergies  BP (!) 95/59 (BP Location: Left Arm)   Pulse (!) 101   Temp 98 F (36.7 C) (Oral)   Resp 18   SpO2 94%   US PELVIS (TRANSABDOMINAL ONLY)  Result Date: 01/15/2022 CLINICAL DATA:  Patient with charge syndrome. Evaluate for uterus and ovaries. EXAM: TRANSABDOMINAL ULTRASOUND OF PELVIS TECHNIQUE: Transabdominal ultrasound examination of the pelvis was performed including evaluation of the uterus, ovaries, adnexal regions, and pelvic cul-de-sac. COMPARISON:  None Available. FINDINGS: Uterus Measurements: 5.7 x 1.6 x 2.6 cm = volume: 12.7 mL. No fibroids or other mass visualized. Endometrium Thickness: 4.5 mm.  No focal abnormality visualized. Right ovary Measurements: 2.2 x 0.8 x 1.1 cm = volume: 1.0 mL. Normal appearance/no adnexal mass. Left ovary Measurements: 2.3 x 1.0 x 2.0 cm = volume: 2.5 mL. Normal appearance/no adnexal mass. Other findings:  No abnormal free fluid. IMPRESSION: The uterus and ovaries are normal in appearance. Electronically Signed   By: Gerome Sam III M.D.   On: 01/15/2022 19:59    Note:  This dictation was prepared with Dragon/digital dictation along with [employer]. Any transcriptional errors that result from this process are unintentional.    Ardeth Sportsman, MD, FACS, MASCRS Esophageal, Gastrointestinal & Colorectal Surgery Robotic and Minimally Invasive Surgery  [facility] [address], [address] [place], [place] [address] [phone] Fax [phone] Main  CONTACT INFORMATION:  Weekday (9AM-5PM): Call [facility] main office at [phone]  Weeknight (5PM-9AM) or Weekend/Holiday: Check [email or web address] (password " [email or web address]") for General Surgery [facility] coverage  (Please, do not use SecureChat as it is not reliable communication to reach operating surgeons for immediate patient care given surgeries/outpatient duties/clinic/cross-coverage/off post-call which would lead to a delay in care.  Epic staff messaging available for outptient concerns, but may not be answered for 48 hours or more).     01/25/2022  1:53 PM

## 2022-02-20 ENCOUNTER — Telehealth: Payer: BC Managed Care – PPO | Admitting: Physician Assistant

## 2022-02-20 DIAGNOSIS — R0981 Nasal congestion: Secondary | ICD-10-CM

## 2022-02-20 DIAGNOSIS — R519 Headache, unspecified: Secondary | ICD-10-CM | POA: Diagnosis not present

## 2022-02-20 MED ORDER — NAPROXEN 500 MG PO TABS: 500.0000 mg | ORAL_TABLET | Freq: Two times a day (BID) | ORAL | 0 refills | Status: AC

## 2022-02-20 MED ORDER — FLUTICASONE PROPIONATE 50 MCG/ACT NA SUSP: 2.0000 | Freq: Every day | NASAL | 0 refills | Status: AC

## 2022-02-20 NOTE — Progress Notes (Signed)
E-Visit for Corona Virus Screening  Your current symptoms could be consistent with the coronavirus.  Many health care providers can now test patients at their office but not all are.  [place] has multiple testing sites. For information on our [place] testing locations and hours go to [email or web address]  We are enrolling you in our [facility] for [place] . Daily you will receive a questionnaire within the [facility] website. Our COVID 19 response team will be monitoring your responses daily.  Testing Information: The COVID-19 Community Testing sites are testing BY APPOINTMENT ONLY.  You can schedule online at [email or web address]  If you do not have access to a smart phone or computer you may call [phone] for an appointment.   Additional testing sites in the Community:  For [facility] Testing sites in [place]  [email or web address]  For Pop-up testing sites in [place]  [email or web address]  For [facility] [email or web address]  For [facility] testing in [place] and [employer] [email or web address]  For [facility] testing in [place]   [email or web address]  For  more information about community testing call [phone]   Please quarantine yourself while awaiting your test results. Please stay home for a minimum of 10 days from the first day of illness with improving symptoms and you have had 24 hours of no fever (without the use of Tylenol (Acetaminophen) Motrin (Ibuprofen) or any fever reducing medication).  Also - Do not get tested prior to returning to work because once you have had a positive test the test can stay positive for  more than a month in some cases.   You should wear a mask or cloth face covering over your nose and mouth if you must be around other people or animals, including pets (even at home). Try to stay at least 6 feet away from other people. This will protect the people around you.  Please continue good preventive care measures, including:  frequent hand-washing, avoid touching your face, cover coughs/sneezes, stay out of crowds and keep a 6 foot distance from others.  COVID-19 is a respiratory illness with symptoms that are similar to the flu. Symptoms are typically mild to moderate, but there have been cases of severe illness and death due to the virus.   The following symptoms may appear 2-14 days after exposure: Fever Cough Shortness of breath or difficulty breathing Chills Repeated shaking with chills Muscle pain Headache Sore throat New loss of taste or smell Fatigue Congestion or runny nose Nausea or vomiting Diarrhea  Go to the nearest hospital ED for assessment if fever/cough/breathlessness are severe or illness seems like a threat to life.  It is vitally important that if you feel that you have an infection such as this virus or any other virus that you stay home and away from places where you may spread it to others.  You should avoid contact with people age 71 and older.   You can use medication such as prescription anti-inflammatory called Naprosyn 500 mg. Take twice daily as needed for fever or body aches for 2 weeks and prescription for Fluticasone nasal spray 2 sprays in each nostril one time per day  You may also take acetaminophen (Tylenol) as needed for fever.  Reduce your risk of any infection by using the same precautions used for avoiding the common cold or flu:  Wash your hands often with soap and warm water for at least 20 seconds.  If soap and water are not readily available, use an alcohol-based hand  sanitizer with at least 60% alcohol.  If coughing or sneezing, cover  your mouth and nose by coughing or sneezing into the elbow areas of your shirt or coat, into a tissue or into your sleeve (not your hands). Avoid shaking hands with others and consider head nods or verbal greetings only. Avoid touching your eyes, nose, or mouth with unwashed hands.  Avoid close contact with people who are sick. Avoid places or events with large numbers of people in one location, like concerts or sporting events. Carefully consider travel plans you have or are making. If you are planning any travel outside or inside the [place], visit the CDC's Travelers' Health webpage for the latest health notices. If you have some symptoms but not all symptoms, continue to monitor at home and seek medical attention if your symptoms worsen. If you are having a medical emergency, call 911.  HOME CARE Only take medications as instructed by your medical team. Drink plenty of fluids and get plenty of rest. A steam or ultrasonic humidifier can help if you have congestion.   GET HELP RIGHT AWAY IF YOU HAVE EMERGENCY WARNING SIGNS** FOR COVID-19. If you or someone is showing any of these signs seek emergency medical care immediately. Call 911 or proceed to your closest emergency facility if: You develop worsening high fever. Trouble breathing Bluish lips or face Persistent pain or pressure in the chest New confusion Inability to wake or stay awake You cough up blood. Your symptoms become more severe  **This list is not all possible symptoms. Contact your medical provider for any symptoms that are sever or concerning to you.  MAKE SURE YOU  Understand these instructions. Will watch your condition. Will get help right away if you are not doing well or get worse.  Your e-visit answers were reviewed by a board certified advanced clinical practitioner to complete your personal care plan.  Depending on the condition, your plan could have included both over the counter or prescription medications.  If  there is a problem please reply once you have received a response from your provider.  Your safety is important to [place].  If you have drug allergies check your prescription carefully.    You can use MyChart to ask questions about today's visit, request a non-urgent call back, or ask for a work or school excuse for 24 hours related to this e-Visit. If it has been greater than 24 hours you will need to follow up with your provider, or enter a new e-Visit to address those concerns. You will get an e-mail in the next two days asking about your experience.  I hope that your e-visit has been valuable and will speed your recovery. Thank you for using e-visits.  I have spent 5 minutes in review of e-visit questionnaire, review and updating patient chart, medical decision making and response to patient.   Mar Daring, PA-C

## 2022-02-24 DIAGNOSIS — K316 Fistula of stomach and duodenum: Secondary | ICD-10-CM | POA: Insufficient documentation

## 2022-02-25 IMAGING — DX DG BONE AGE
1 series · 1 of 1 positions shown · non-contrast
Comparison: October 02, 2014.

CLINICAL DATA: Primary amenorrhea.

EXAM:
BONE AGE DETERMINATION .
TECHNIQUE: AP radiographs of the hand and wrist are correlated with the
developmental standards of Greulich and Pyle.

[dg bone age]
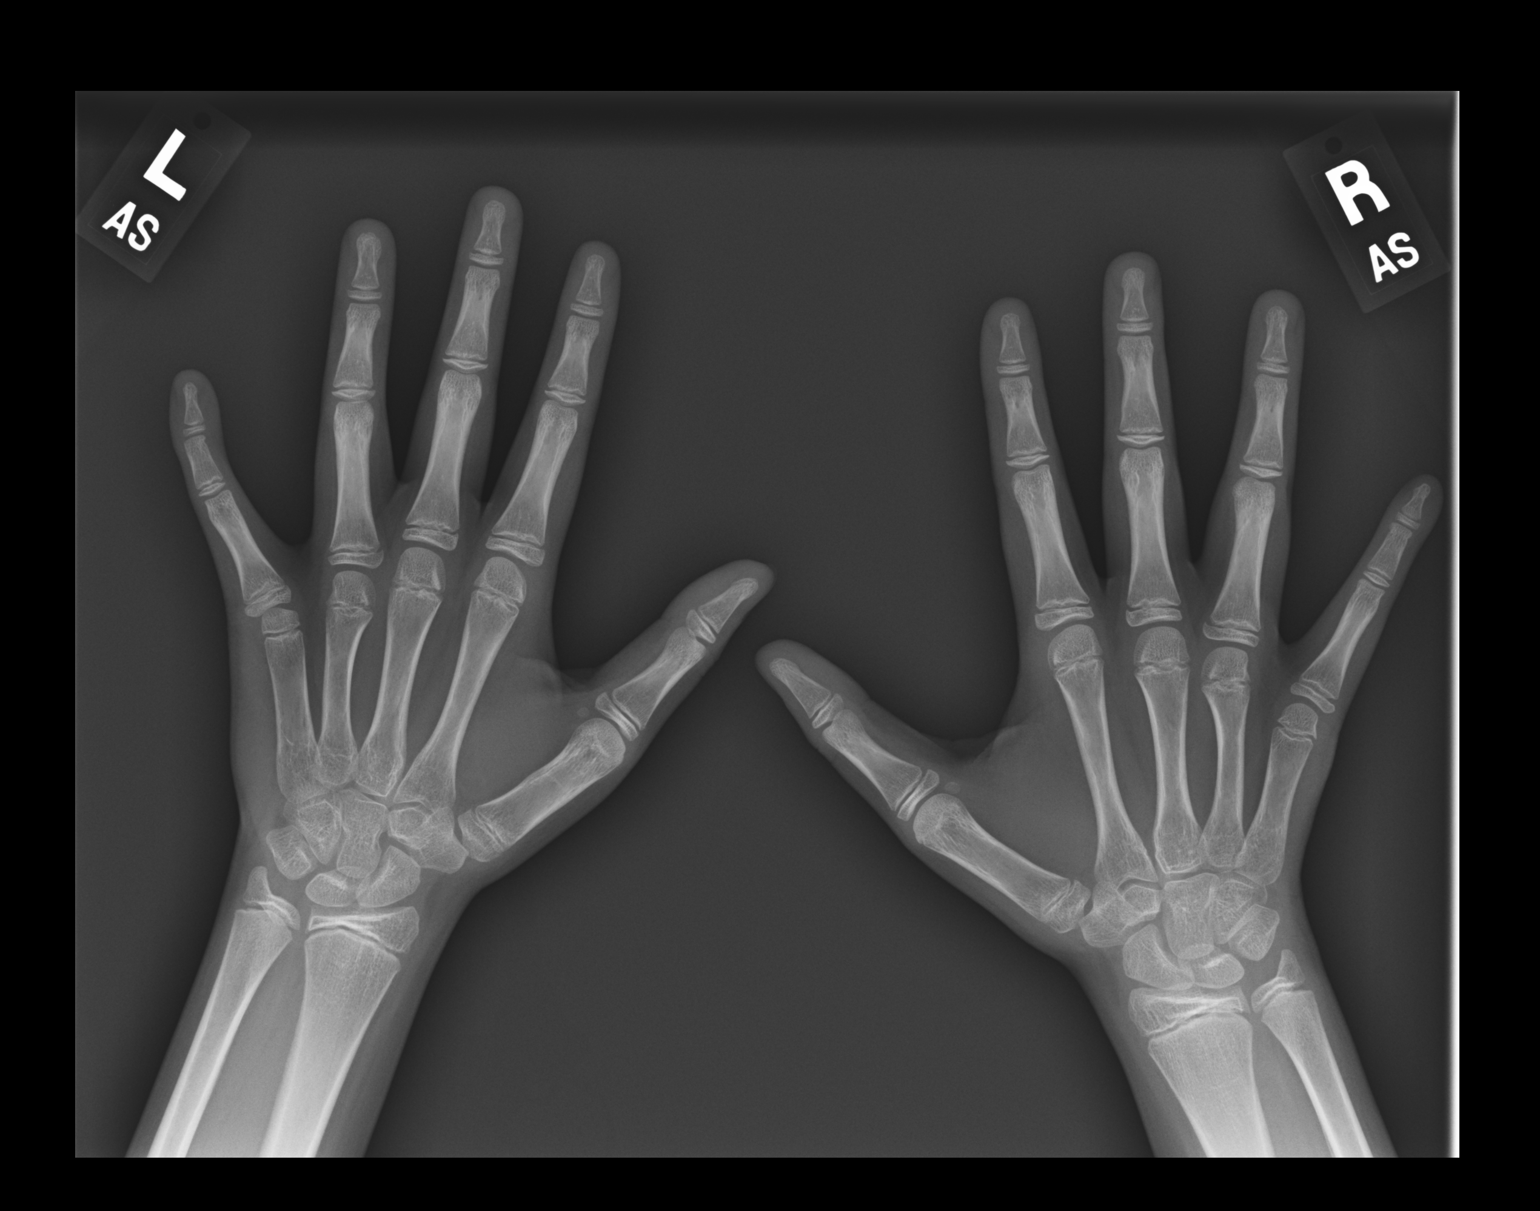

[1 of 1 positions shown; findings below may reference images not displayed]

FINDINGS: Chronologic age:  18 years 11 months (date of birth 05/24/2000)

Bone age:  13 years 0 months; standard deviation =+-11.2 months
IMPRESSION: Bone age is significantly delayed compared to chronologic age.

## 2022-03-20 ENCOUNTER — Encounter (INDEPENDENT_AMBULATORY_CARE_PROVIDER_SITE_OTHER): Payer: Self-pay

## 2022-04-11 DIAGNOSIS — H90A31 Mixed conductive and sensorineural hearing loss, unilateral, right ear with restricted hearing on the contralateral side: Secondary | ICD-10-CM | POA: Insufficient documentation

## 2022-04-14 ENCOUNTER — Telehealth (INDEPENDENT_AMBULATORY_CARE_PROVIDER_SITE_OTHER): Payer: Self-pay | Admitting: Pediatric Endocrinology

## 2022-04-14 NOTE — Telephone Encounter (Signed)
Called mom to see which medication she is referring to, left HIPAA approved voicemail for return phone call

## 2022-04-14 NOTE — Telephone Encounter (Signed)
  Name of who is calling: Genevie Ann Relationship to Patient:  mom  Best contact number: [phone]  Provider they see: Baldo Ash  Reason for call: Mom calling was curious bc during last appt she thought Dr. Baldo Ash  had increased the dosage but she has noticed the strength of the dosage has gone down and wanted to double check if that was correct.      PRESCRIPTION REFILL ONLY  Name of prescription:  Pharmacy:

## 2022-04-15 ENCOUNTER — Other Ambulatory Visit (INDEPENDENT_AMBULATORY_CARE_PROVIDER_SITE_OTHER): Payer: Self-pay | Admitting: Pediatric Endocrinology

## 2022-04-15 MED ORDER — ESTRADIOL 0.05 MG/24HR TD PTWK: 0.0500 mg | MEDICATED_PATCH | TRANSDERMAL | 3 refills | Status: DC

## 2022-04-15 NOTE — Telephone Encounter (Signed)
My error- new patches sent to pharmacy. If they picked up the 25s then she can wear 2 of them until they get the new script. (2 at the same time)

## 2022-04-15 NOTE — Telephone Encounter (Signed)
Reviewed chart per Dr. Montey Hora last lab note  Change from Vivelle dot (twice a week) to Climara (once a week)."  The last orders of each are  vivelle dot  is 0.0375 mg and the climara is 0.025 mg  Will route to the provider for advise

## 2022-04-16 NOTE — Telephone Encounter (Signed)
No- she can just start the new patches.

## 2022-04-16 NOTE — Telephone Encounter (Signed)
Called mom to relay Dr. Montey Hora message.  Mom asked if they need to do anything since she has been wearing the current script and noticed it was a lower dose than she had previously been on before starting the new dose. I told mom I would ask Dr. Baldo Ash and call her back.  Mom stated it is ok to leave a voicemail with an answer.

## 2022-04-16 NOTE — Telephone Encounter (Signed)
Called mom to relay Dr. Montey Hora message that she can start the new patches. Left voicemail per mom's request during last phone call, patient may start new patches.  If she has any questions she can call or send me a mychart.

## 2022-06-23 ENCOUNTER — Encounter (INDEPENDENT_AMBULATORY_CARE_PROVIDER_SITE_OTHER): Payer: Self-pay

## 2022-07-09 ENCOUNTER — Ambulatory Visit (INDEPENDENT_AMBULATORY_CARE_PROVIDER_SITE_OTHER): Payer: BC Managed Care – PPO | Admitting: Pediatric Endocrinology

## 2022-07-09 ENCOUNTER — Encounter (INDEPENDENT_AMBULATORY_CARE_PROVIDER_SITE_OTHER): Payer: Self-pay | Admitting: Pediatric Endocrinology

## 2022-07-09 VITALS — BP 106/66 | HR 104 | Ht 61.81 in | Wt 101.6 lb

## 2022-07-09 DIAGNOSIS — Z79899 Other long term (current) drug therapy: Secondary | ICD-10-CM

## 2022-07-09 DIAGNOSIS — F89 Unspecified disorder of psychological development: Secondary | ICD-10-CM | POA: Insufficient documentation

## 2022-07-09 DIAGNOSIS — Z931 Gastrostomy status: Secondary | ICD-10-CM

## 2022-07-09 DIAGNOSIS — R6252 Short stature (child): Secondary | ICD-10-CM

## 2022-07-09 DIAGNOSIS — N910 Primary amenorrhea: Secondary | ICD-10-CM

## 2022-07-09 DIAGNOSIS — Q898 Other specified congenital malformations: Secondary | ICD-10-CM | POA: Diagnosis not present

## 2022-07-09 DIAGNOSIS — E230 Hypopituitarism: Secondary | ICD-10-CM

## 2022-07-09 NOTE — Progress Notes (Signed)
Subjective:  Patient Name: Kayla Mckinney Date of Birth: 2000/08/01  MRN: [id number]  Kayla Mckinney  presents to the office today for follow-up evaluation and management  of her CHARGE association and growth delay  HISTORY OF PRESENT ILLNESS:   Kayla Mckinney is a 22 y.o. Caucasian female .  [name] was accompanied by her mother and sister   1. [name] was first seen in our clinic on 11/01/09 in referral from her primary care pediatrician, Dr. 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 During the first few months of life, the baby had significant problems with reflux, vomiting, and growth delay. CHARGE Association was diagnosed at about 85 weeks of age by Dr. Ellamae Sia, who was then the geneticist at [facility] in [place]. Trinady has since been followed by Dr. Roel Cluck of the [facility] in [place]. Her PEG feeding tube was placed at 6 months. She was at about the 10th percentile for length up through age 89, but then slowly fell off the growth curve and was at less than 3rd percentile. 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 She wears a hearing aid in her right ear. 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 The child underwent a Nissen fundoplication at about 57 months of age. The child was noted to have a poor sense of smell and abnormal taste sensation. Muscle tone was low. Her  fifth DIP joints did not bend. She had a very high tolerance for pain.  Laboratory data from 05/31/09 showed a normal CMP. Her TSH was 2.537, free T4-1 0.30, and free T3-3 0.8, all of which were normal. Her IGF 1-1 was normal at 176. Her IGF BP-3 was normal at 2.6. On 05/31/09 the child's bone age was 6 years 10 months at a chronologic age of 9 years. She had growth hormone stimulation testing  on 12/21/1109/08/11. Her peak growth hormone responses were 4.83 and 7.67 respectively (normal >10).  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 However, clinical review and review of the literature does not reveal any advantage to treating CHARGE association patients with growth hormone and it may actually, be detrimental.     2. The patient's last [facility] visit was on 12/25/21. In the interim she has been generally healthy.   She is now using the [place] once a week estradiol patch at 50 mcg weekly. There was confusion with dosing and the incorrect dose was sent to the pharmacy when a refill was requested. She did take the lower (25 mcg) patch for about 2 months before they realized that she was meant to increase  from 37.5 mcg to 50 mcg and instead had decreased to 25 mcg.   She has not yet had menarche. Mom is wondering why this has happened.   [name] is unsure if she is having vaginal discharge on a regular basis although she is certain that she has had it on occasion. She is unsure when she last noticed it or when she first noticed it.    3. Pertinent Review of Systems:   Constitutional: The patient feels "good". The patient seems healthy and active. Eyes: Wears glasses. Neck: There are no recognized problems of the anterior neck.  Heart: There are no recognized heart problems. The ability to play and do other physical activities seems normal.  Lungs: no asthma or wheezing.  Gastrointestinal: Bowel movents seem normal. There are no recognized GI problems. G-tube. Some blood on stool recently.  Issues with her g-tube Legs: Muscle mass and strength seem normal. The child can play and perform other physical activities without obvious discomfort. No edema is noted.  Feet: There are no obvious foot problems. No edema is noted. Neurologic: There are no recognized problems with muscle movement and strength, sensation, or coordination. Ears- hearing aid- getting a new one.  Has ear tubes.   Puberty- increased breast growth.   PAST MEDICAL, FAMILY, AND SOCIAL HISTORY  Past Medical History:  Diagnosis Date   ADHD (attention deficit hyperactivity disorder)    Asperger's syndrome    Aspiration of postnatal stomach contents without respiratory symptoms    CHARGE association    Coloboma, optic disc, congenital, left eye    Cyclical vomiting syndrome    G tube feedings (HCC)    Goiter    Hearing loss in right ear    Obsessive compulsive disorder    Physical growth delay    Raynaud's syndrome    Sensory processing difficulty    Swallowing difficulty     Family History  Problem Relation Age of Onset   Thyroid disease Mother        Mom has a goiter.   Thyroid disease Maternal Grandmother        MGM became hypothyroid spontaneously. never had surgery of XRT.   Diabetes Cousin      Current Outpatient Medications:    Amphetamine (ADZENYS XR-ODT PO), Take by mouth., Disp: , Rfl:    Cholecalciferol (VITAMIN D3) 50 MCG (2000 UT) capsule, Take 2,000 Units by mouth daily., Disp: , Rfl:    dexmethylphenidate (FOCALIN) 10 MG tablet, Take 10 mg by mouth daily., Disp: , Rfl:    dexmethylphenidate (FOCALIN) 5 MG tablet, Take 5 mg by mouth daily. , Disp: , Rfl:    estradiol (CLIMARA - DOSED IN MG/24 HR) 0.05 mg/24hr patch, Place 1 patch (0.05 mg total) onto the skin once a week., Disp: 12 patch, Rfl: 3   methylphenidate (RITALIN) 10 MG tablet, Take 10 mg by mouth daily as needed., Disp: , Rfl:    naproxen (NAPROSYN) 500 MG tablet, Take 1 tablet (500 mg total) by mouth 2 (two) times daily with a meal., Disp: 30 tablet, Rfl: 0   Nutritional Supplements (DUOCAL) POWD, Take by mouth. 4 scoops every meal when she eats at home. Does not get it at school., Disp: , Rfl:    SUMAtriptan (IMITREX) 20 MG/ACT nasal spray, Place into the nose. PRN, Disp: , Rfl:    Vitamin D, Ergocalciferol, (DRISDOL) 1.25 MG (50000 UNIT) CAPS capsule, Take 1 capsule (50,000 Units total) by mouth  every 7 (seven) days., Disp: 15 capsule, Rfl: 0   cyproheptadine (PERIACTIN) 2 MG/5ML syrup, Take 5 mLs (2 mg total) by mouth 2 (two) times daily., Disp: 300 mL, Rfl: 1   fluticasone (FLONASE) 50 MCG/ACT nasal spray, Place 2 sprays into both nostrils daily., Disp: 16 g, Rfl: 0   hydrOXYzine (ATARAX) 10 MG tablet, Take 10 mg by mouth at bedtime. Reported on 04/12/2015, Disp: , Rfl:   Allergies as of 07/09/2022   (No Known Allergies)     reports that she has never smoked. She has never been exposed to tobacco smoke. She has never used smokeless tobacco. She reports that she does not drink alcohol and does not use drugs. Pediatric History  Patient Parents   Elliot, Murrey (  Mother)   Other Topics Concern   Not on file  Social History Narrative   Lives with mom, mom's boy friend, and sister. Dad is involved.   Graduated from high school.    2nd year at [employer]   Primary Care Provider: Lewis Moccasin, MD  ROS: There are no other significant problems involving Katonya's other body systems.   Objective:  Vital Signs:   BP 106/66   Pulse (!) 104   Ht 5' 1.81" (1.57 m)   Wt 101 lb 10.1 oz (46.1 kg)   BMI 18.70 kg/m  Growth %ile SmartLinks can only be used for patients less than 31 years old.   Ht Readings from Last 3 Encounters:  07/09/22 5' 1.81" (1.57 m)  12/25/21 5' 0.39" (1.534 m)  01/28/21 5\' 1"  (1.549 m) (10 %, Z= -1.29)*   * Growth percentiles are based on CDC (Girls, 2-20 Years) data.   Wt Readings from Last 3 Encounters:  07/09/22 101 lb 10.1 oz (46.1 kg)  12/25/21 97 lb 9.6 oz (44.3 kg)  01/28/21 85 lb (38.6 kg) (<1 %, Z= -3.45)*   * Growth percentiles are based on CDC (Girls, 2-20 Years) data.   HC Readings from Last 3 Encounters:  No data found for [facility]   Body surface area is 1.42 meters squared.  Facility age limit for growth %iles is 20 years. Facility age limit for growth %iles is 20 years. Facility age limit for growth %iles is 20  years.   PHYSICAL EXAM:    Constitutional: The patient appears healthy and well nourished. The patient's height and weight are delayed for age. She has had some additional linear growth and gained 3 pounds.  Head: The head is normocephalic. Face: broad forehead. Eyes: hypertelorism. Gaze is conjugate. There is no obvious arcus or proptosis. Moisture appears normal. Ears: Low set externally rotated ears Mouth: The oropharynx and tongue appear normal. Dentition appears to be normal for age. Oral moisture is normal. Neck: The neck appears to be visibly normal. The thyroid gland is 10 grams in size. The consistency of the thyroid gland is normal. The thyroid gland is not tender to palpation. Lungs: No increased work of breathing. CTA Heart: Heart rate regular. Pulses and peripheral perfusion regular. RRR S1S2 Abdomen: The abdomen appears to be small in size for the patient's age.  There is no obvious hepatomegaly, splenomegaly, or other mass effect.  Arms: Muscle size and bulk are normal for age. Hands: There is no obvious tremor. Phalangeal and metacarpophalangeal joints are normal. Palmar muscles are normal for age. Palmar skin is normal. Palmar moisture is also normal. Legs: Muscles appear normal for age. No edema is present. Feet: Feet are normally formed. Dorsalis pedal pulses are normal. Neurologic: Strength is normal for age in both the upper and lower extremities. Muscle tone is normal. Sensation to touch is normal in both the legs and feet.   Puberty: Tanner stage pubic hair: III Tanner stage breast/genital IV. Sparse Axillary hair.   LAB DATA:   No results found for this or any previous visit (from the past 504 hour(s)).  12/2021 Transabdomial pelvic ultrasound: IMPRESSION: The uterus and ovaries are normal in appearance.    Assessment and Plan:   ASSESSMENT: Derinda is a 22 y.o. 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    PLAN:   1. Diagnostic:  Lab Orders         Luteinizing hormone         Follicle stimulating hormone         Estradiol, Ultra Sens      Orders Placed This Encounter  Procedures   Luteinizing hormone   Follicle stimulating hormone   Estradiol, Ultra Sens     2. Therapeutic: Continue working on getting adequate nutrition for both growth and weight gain. Continue transdermal estradiol 50 mcg weekly.    3. Patient education: Reviewed growth charts. Discussed expectation that she will need HRT long term. Discussed options for menstrual suppression/regulation but that she will need to achieve adequate estradiol levels for menses before we can move towards that stage of care.   4. Follow-up: No follow-ups on file.  Dessa Phi, MD  >40 minutes spent today reviewing the medical chart, counseling the patient/family, and documenting today's encounter.

## 2022-07-16 LAB — ESTRADIOL, ULTRA SENS: Estradiol, Ultra Sensitive: 54 pg/mL

## 2022-07-20 ENCOUNTER — Encounter (INDEPENDENT_AMBULATORY_CARE_PROVIDER_SITE_OTHER): Payer: Self-pay | Admitting: Pediatric Endocrinology

## 2022-07-21 MED ORDER — MEDROXYPROGESTERONE ACETATE 10 MG PO TABS: 10.0000 mg | ORAL_TABLET | Freq: Every day | ORAL | 0 refills | Status: DC

## 2022-08-22 ENCOUNTER — Encounter (INDEPENDENT_AMBULATORY_CARE_PROVIDER_SITE_OTHER): Payer: Self-pay

## 2022-09-09 ENCOUNTER — Encounter (INDEPENDENT_AMBULATORY_CARE_PROVIDER_SITE_OTHER): Payer: Self-pay | Admitting: Pediatric Endocrinology

## 2022-10-17 ENCOUNTER — Other Ambulatory Visit (INDEPENDENT_AMBULATORY_CARE_PROVIDER_SITE_OTHER): Payer: Self-pay | Admitting: Pediatric Endocrinology

## 2022-12-11 ENCOUNTER — Other Ambulatory Visit (INDEPENDENT_AMBULATORY_CARE_PROVIDER_SITE_OTHER): Payer: Self-pay | Admitting: Pediatric Endocrinology

## 2022-12-12 ENCOUNTER — Telehealth (INDEPENDENT_AMBULATORY_CARE_PROVIDER_SITE_OTHER): Payer: Self-pay

## 2022-12-12 ENCOUNTER — Other Ambulatory Visit (INDEPENDENT_AMBULATORY_CARE_PROVIDER_SITE_OTHER): Payer: Self-pay | Admitting: Family

## 2022-12-12 DIAGNOSIS — N910 Primary amenorrhea: Secondary | ICD-10-CM

## 2022-12-12 DIAGNOSIS — E230 Hypopituitarism: Secondary | ICD-10-CM

## 2022-12-12 NOTE — Telephone Encounter (Signed)
Called pharmacy and explained that Dr. Vanessa Haena is no longer with the practice and I am waiting for a call from mom to verify that [name] is able to get an appointment with ob-gyn in a timely manner and get a refill with them or if other steps are needed to be made.

## 2022-12-12 NOTE — Telephone Encounter (Signed)
Received fax from [facility] since Dr. Vanessa Shiloh is no longer at our practice and Seychelle needs a refill for Provera. After speaking to on-call, who called Dr. Vanessa Garner to see what she would recommend, a referral for ob-gyn was placed to take over this care.

## 2022-12-12 NOTE — Telephone Encounter (Signed)
Per DPR, permission to speak to mom and leave a detailed message.  Left detailed message stating that since Dr. Vanessa Morrison is not with our practice anymore, a referral was placed to ob-gyn to continue care and for them to take over the Provera medication. Relayed that is they are in need of medication before they can get an appointment, to call out office. Left office number for mom to call back.

## 2023-04-13 ENCOUNTER — Telehealth (INDEPENDENT_AMBULATORY_CARE_PROVIDER_SITE_OTHER): Payer: Self-pay

## 2023-04-13 DIAGNOSIS — Z79899 Other long term (current) drug therapy: Secondary | ICD-10-CM

## 2023-04-13 DIAGNOSIS — Z7187 Encounter for pediatric-to-adult transition counseling: Secondary | ICD-10-CM

## 2023-04-13 DIAGNOSIS — E230 Hypopituitarism: Secondary | ICD-10-CM

## 2023-04-13 NOTE — Telephone Encounter (Signed)
  Name of who is calling: Rolly Salter Relationship to Patient: self  Best contact number: [phone]  Provider they see: previous [facility] pt  Reason for call: Kayla Mckinney is calling in hopes that she can get a referral to adult endo since Dr. Vanessa [place] is no longer here.      PRESCRIPTION REFILL ONLY  Name of prescription:  Pharmacy:

## 2023-04-14 DIAGNOSIS — Z7187 Encounter for pediatric-to-adult transition counseling: Secondary | ICD-10-CM | POA: Insufficient documentation

## 2023-04-14 NOTE — Telephone Encounter (Signed)
 Orders Placed This Encounter  Procedures   Ambulatory referral to Endocrinology    Silvana Newness, MD 04/14/2023

## 2023-04-15 NOTE — Telephone Encounter (Signed)
Referral is processed.

## 2023-05-26 ENCOUNTER — Encounter (INDEPENDENT_AMBULATORY_CARE_PROVIDER_SITE_OTHER): Payer: Self-pay

## 2023-06-08 ENCOUNTER — Encounter (INDEPENDENT_AMBULATORY_CARE_PROVIDER_SITE_OTHER): Payer: Self-pay

## 2023-07-14 ENCOUNTER — Ambulatory Visit: Admitting: "Endocrinology

## 2023-07-14 ENCOUNTER — Encounter: Payer: Self-pay | Admitting: "Endocrinology

## 2023-07-14 VITALS — BP 100/80 | HR 78 | Ht 61.0 in | Wt 94.0 lb

## 2023-07-14 DIAGNOSIS — E230 Hypopituitarism: Secondary | ICD-10-CM

## 2023-07-14 NOTE — Progress Notes (Signed)
 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 or CHARGE (coloboma of the eye, heart anomalies, choanal atresia, retardation, genital and ear anomalies) 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)  12/2021 ultrasound pelvis reported the uterus and ovaries are normal in appearance  Patient is non complaint with transdermal estradiol  50 mcg weekly-last taken around 2024 October Ordered baseline estradiol  levels Patient reports issues with the patch previously sometimes coming off too quickly, prescribed to use Tegaderm if that happens again and change the site Patient also reports at some point irritation because the patch would not come off nicely, instructed to use adhesive removing spray to help with that Patient to follow-up with OB/GYN    Return in about 3 months (around 10/14/2023) for visit, labs today.   I have reviewed current medications, nurse's notes, allergies, vital signs, past medical and surgical history, family medical history, and social history for this encounter. Counseled patient on symptoms, examination findings, lab findings, imaging results, treatment decisions and monitoring and prognosis. The patient understood the recommendations and agrees with the treatment plan. All questions regarding treatment plan were fully answered.  Kayla Newcomer, MD   07/14/23   History of Present Illness HPI  Kayla Mckinney is a 23 y.o. 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  Patient is here with her mom to establish care  Current regimen: None  Patient is non complaint with transdermal estradiol  50 mcg weekly Patient reports issues with the patch previously sometimes coming off too quickly Patient also reports at some point irritation because the patch would not come off nicely,  Per records from pediatric endocrinologist Ovidio Blower:  She has a history of growth delay associated with CHARGE Syndrome. "During the first few months of life, the baby had significant problems with reflux, vomiting, and growth delay. CHARGE Association was diagnosed at about 73 weeks of age by Dr. Coit Dasen, who was then the geneticist at [facility] in [place]. Marvene has since been followed by Dr. Catarino Clines of the [facility] [place]. Her PEG feeding tube was placed at 6 months. She was at about the 10th percentile for length up through age 59, but then slowly fell off the growth curve and was at less than 3rd percentile. 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 She wears a hearing aid in her right ear. She was later diagnosed with Asperger's syndrome. The child underwent a Nissen fundoplication at about 29 months of age. The child was noted to have a poor sense of smell and abnormal taste sensation. Muscle tone was low. Her fifth DIP joints did not bend. She had a very high tolerance for pain.   Laboratory data from 05/31/09 showed a normal CMP. Her TSH  was 2.537, free T4-1 0.30, and free T3-3 0.8, all of which were normal. Her IGF 1-1 was normal at 176. Her IGF BP-3 was normal at 2.6. On 05/31/09 the child's bone age  was 6 years 10 months at a chronologic age of 9 years. She had growth hormone stimulation testing on 12/21/1109/08/11. Her peak growth hormone responses were 4.83 and 7.67 respectively (normal >10).  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 However, clinical review and review of the literature does not reveal any advantage to treating CHARGE association patients with growth hormone and it may actually, be detrimental.   She did take the lower (25 mcg) patch for about 2 months before they realized that she was meant to increase  from 37.5 mcg to 50 mcg and instead had decreased to 25 mcg."  Physical Exam  BP 100/80   Pulse 78   Ht 5\' 1"  (1.549 m)   Wt 94 lb (42.6 kg)   SpO2 98%   BMI 17.76 kg/m    Constitutional: well developed, well nourished Head: normocephalic, atraumatic Eyes: sclera anicteric, no redness Neck: supple Chest: + axillary hair, + bilateral breast development Lungs: normal respiratory effort Neurology: alert and oriented Skin: dry, no appreciable rashes Musculoskeletal: no appreciable defects Psychiatric: normal mood and affect   Current Medications Patient's Medications  New Prescriptions   No medications on file  Previous Medications   ADZENYS XR-ODT 12.5 MG TBED    Take 1 tablet by mouth every morning.   AMPHETAMINE (ADZENYS XR-ODT PO)    Take by mouth.   CHOLECALCIFEROL (VITAMIN D3) 50 MCG (2000 UT) CAPSULE    Take 2,000 Units by mouth daily.   CYPROHEPTADINE  (PERIACTIN ) 2 MG/5ML SYRUP    Take 5 mLs (2 mg total) by mouth 2 (two) times daily.   DEXMETHYLPHENIDATE (FOCALIN) 10 MG TABLET    Take 10 mg by mouth daily.   DEXMETHYLPHENIDATE (FOCALIN) 5 MG TABLET    Take 5 mg by mouth daily.    ESTRADIOL  (CLIMARA  - DOSED IN MG/24 HR) 0.05 MG/24HR PATCH    Place 1 patch (0.05 mg total) onto the skin once a week.   FLUTICASONE  (FLONASE ) 50 MCG/ACT NASAL SPRAY    Place 2 sprays into both nostrils daily.   HYDROXYZINE (ATARAX) 10 MG TABLET    Take  10 mg by mouth at bedtime. Reported on 04/12/2015   MEDROXYPROGESTERONE  (PROVERA ) 10 MG TABLET    Take 1 tablet (10 mg total) by mouth daily.   METHYLPHENIDATE (RITALIN) 10 MG TABLET    Take 10 mg by mouth daily as needed.   NAPROXEN  (NAPROSYN ) 500 MG TABLET    Take 1 tablet (500 mg total) by mouth 2 (two) times daily with a meal.   NUTRITIONAL SUPPLEMENTS (DUOCAL) POWD    Take by mouth. 4 scoops every meal when she eats at home. Does not get it at school.   SUMATRIPTAN (IMITREX) 20 MG/ACT NASAL SPRAY    Place into the nose. PRN   VITAMIN D , ERGOCALCIFEROL , (DRISDOL ) 1.25 MG (50000 UNIT) CAPS CAPSULE    Take 1 capsule (50,000 Units total) by mouth every 7 (seven) days.  Modified Medications   No medications on file  Discontinued Medications   No medications on file    Allergies No Known Allergies  Past Medical History Past Medical History:  Diagnosis Date   ADHD (attention deficit hyperactivity disorder)    Asperger's syndrome    Aspiration of postnatal stomach contents without respiratory symptoms  CHARGE association    Coloboma, optic disc, congenital, left eye    Cyclical vomiting syndrome    G tube feedings (HCC)    Goiter    Hearing loss in right ear    Obsessive compulsive disorder    Physical growth delay    Raynaud's syndrome    Sensory processing difficulty    Swallowing difficulty     Past Surgical History Past Surgical History:  Procedure Laterality Date   GASTROSTOMY W/ FEEDING TUBE     NISSEN FUNDOPLICATION     PEG TUBE PLACEMENT     TONSILLECTOMY AND ADENOIDECTOMY     WISDOM TOOTH EXTRACTION  09/19/2021   all 4 wisdom teeth extraction    Family History family history includes Diabetes in her cousin; Thyroid disease in her maternal grandmother and mother.  Social History Social History   Socioeconomic History   Marital status: Single    Spouse name: Not on file   Number of children: Not on file   Years of education: Not on file   Highest  education level: Not on file  Occupational History   Not on file  Tobacco Use   Smoking status: Never    Passive exposure: Never   Smokeless tobacco: Never  Vaping Use   Vaping status: Never Used  Substance and Sexual Activity   Alcohol use: Never   Drug use: Never   Sexual activity: Never  Other Topics Concern   Not on file  Social History Narrative   Lives with mom, mom's boy friend, and sister. Dad is involved.   Graduated from high school.    Social Drivers of Corporate investment banker Strain: Not on file  Food Insecurity: Not on file  Transportation Needs: Not on file  Physical Activity: Not on file  Stress: Not on file  Social Connections: Not on file  Intimate Partner Violence: Not on file    No results found for: "CHOL" No results found for: "HDL" No results found for: "LDLCALC" No results found for: "TRIG" No results found for: "CHOLHDL" Lab Results  Component Value Date   CREATININE 0.67 01/16/2021   No results found for: "GFR"    Component Value Date/Time   NA 140 01/16/2021 1448   K 4.6 01/16/2021 1448   CL 104 01/16/2021 1448   CO2 25 01/16/2021 1448   GLUCOSE 74 01/16/2021 1448   BUN 18 01/16/2021 1448   CREATININE 0.67 01/16/2021 1448   CALCIUM 10.3 01/16/2021 1448   PROT 7.3 01/16/2021 1448   AST 15 01/16/2021 1448   ALT 15 01/16/2021 1448   BILITOT 0.4 01/16/2021 1448      Latest Ref Rng & Units 01/16/2021    2:48 PM  BMP  Glucose 65 - 139 mg/dL 74   BUN 7 - 20 mg/dL 18   Creatinine 1.61 - 0.96 mg/dL 0.96   BUN/Creat Ratio 6 - 22 (calc) NOT APPLICABLE   Sodium 135 - 146 mmol/L 140   Potassium 3.8 - 5.1 mmol/L 4.6   Chloride 98 - 110 mmol/L 104   CO2 20 - 32 mmol/L 25   Calcium 8.9 - 10.4 mg/dL 04.5     No results found for: "WBC", "RBC", "HGB", "HCT", "PLT", "MCV", "MCH", "MCHC", "RDW", "LYMPHSABS", "MONOABS", "EOSABS", "BASOSABS" Lab Results  Component Value Date   TSH 2.07 01/16/2021         Parts of this note may have  been dictated using voice recognition software. There may be variances in spelling  and vocabulary which are unintentional. Not all errors are proofread. Please notify the [facility] if any discrepancies are noted or if the meaning of any statement is not clear.

## 2023-07-25 LAB — LUTEINIZING HORMONE: LH: 0.2 m[IU]/mL — ABNORMAL LOW

## 2023-07-25 LAB — ESTRADIOL, ULTRA SENS: Estradiol, Ultra Sensitive: <2 pg/mL

## 2023-07-25 LAB — FOLLICLE STIMULATING HORMONE: FSH: 1.2 m[IU]/mL — ABNORMAL LOW

## 2023-08-27 ENCOUNTER — Other Ambulatory Visit (INDEPENDENT_AMBULATORY_CARE_PROVIDER_SITE_OTHER): Payer: Self-pay | Admitting: Pediatric Endocrinology

## 2023-08-30 ENCOUNTER — Encounter: Payer: Self-pay | Admitting: "Endocrinology

## 2023-08-30 DIAGNOSIS — Z79818 Long term (current) use of other agents affecting estrogen receptors and estrogen levels: Secondary | ICD-10-CM

## 2023-08-31 MED ORDER — ESTRADIOL 0.05 MG/24HR TD PTWK: 0.0500 mg | MEDICATED_PATCH | TRANSDERMAL | 3 refills | Status: AC

## 2023-10-15 ENCOUNTER — Ambulatory Visit: Admitting: "Endocrinology
